# Patient Record
Sex: Male | Born: 1980 | Race: Black or African American | Hispanic: No | Marital: Married | Smoking: Never smoker
Health system: Southern US, Community
[De-identification: ages and names within clinical notes are randomized; demographics above are authoritative.]

## PROBLEM LIST (undated history)

## (undated) HISTORY — PX: APPENDECTOMY (OPEN): SHX54

## (undated) HISTORY — PX: HERNIA REPAIR: SHX51

---

## 2013-06-13 ENCOUNTER — Encounter (HOSPITAL_COMMUNITY): Payer: Self-pay | Admitting: Emergency Medicine

## 2013-06-13 DIAGNOSIS — R630 Anorexia: Secondary | ICD-10-CM | POA: Insufficient documentation

## 2013-06-13 DIAGNOSIS — R1013 Epigastric pain: Secondary | ICD-10-CM | POA: Insufficient documentation

## 2013-06-13 DIAGNOSIS — R112 Nausea with vomiting, unspecified: Secondary | ICD-10-CM | POA: Insufficient documentation

## 2013-06-13 DIAGNOSIS — K219 Gastro-esophageal reflux disease without esophagitis: Secondary | ICD-10-CM | POA: Insufficient documentation

## 2013-06-13 DIAGNOSIS — R61 Generalized hyperhidrosis: Secondary | ICD-10-CM | POA: Insufficient documentation

## 2013-06-13 DIAGNOSIS — Z8719 Personal history of other diseases of the digestive system: Secondary | ICD-10-CM | POA: Insufficient documentation

## 2013-06-13 DIAGNOSIS — R6883 Chills (without fever): Secondary | ICD-10-CM | POA: Insufficient documentation

## 2013-06-13 DIAGNOSIS — R197 Diarrhea, unspecified: Secondary | ICD-10-CM | POA: Insufficient documentation

## 2013-06-13 LAB — LIPASE, BLOOD: Lipase: 36 U/L (ref 11–59)

## 2013-06-13 LAB — COMPREHENSIVE METABOLIC PANEL
ALK PHOS: 47 U/L (ref 39–117)
ALT: 39 U/L (ref 0–53)
AST: 39 U/L — AB (ref 0–37)
Albumin: 4.2 g/dL (ref 3.5–5.2)
BILIRUBIN TOTAL: 0.4 mg/dL (ref 0.3–1.2)
BUN: 21 mg/dL (ref 6–23)
CALCIUM: 9.7 mg/dL (ref 8.4–10.5)
CHLORIDE: 102 meq/L (ref 96–112)
CO2: 26 meq/L (ref 19–32)
Creatinine, Ser: 1.17 mg/dL (ref 0.50–1.35)
GFR, EST NON AFRICAN AMERICAN: 82 mL/min — AB (ref >90)
GLUCOSE: 124 mg/dL — AB (ref 70–99)
Potassium: 4.1 mEq/L (ref 3.7–5.3)
SODIUM: 143 meq/L (ref 137–147)
Total Protein: 8.1 g/dL (ref 6.0–8.3)

## 2013-06-13 LAB — CBC WITH DIFFERENTIAL/PLATELET
Basophils Absolute: 0 10*3/uL (ref 0.0–0.1)
Basophils Relative: 0 % (ref 0–1)
Eosinophils Absolute: 0 10*3/uL (ref 0.0–0.7)
Eosinophils Relative: 0 % (ref 0–5)
HEMATOCRIT: 45.5 % (ref 39.0–52.0)
Hemoglobin: 15.7 g/dL (ref 13.0–17.0)
LYMPHS ABS: 0.2 10*3/uL — AB (ref 0.7–4.0)
LYMPHS PCT: 2 % — AB (ref 12–46)
MCH: 30.6 pg (ref 26.0–34.0)
MCHC: 34.5 g/dL (ref 30.0–36.0)
MCV: 88.7 fL (ref 78.0–100.0)
Monocytes Absolute: 0.5 10*3/uL (ref 0.1–1.0)
Monocytes Relative: 6 % (ref 3–12)
NEUTROS ABS: 7.5 10*3/uL (ref 1.7–7.7)
Neutrophils Relative %: 92 % — ABNORMAL HIGH (ref 43–77)
PLATELETS: 171 10*3/uL (ref 150–400)
RBC: 5.13 MIL/uL (ref 4.22–5.81)
RDW: 12.6 % (ref 11.5–15.5)
WBC: 8.2 10*3/uL (ref 4.0–10.5)

## 2013-06-13 LAB — POCT I-STAT TROPONIN I: Troponin i, poc: 0.01 ng/mL (ref 0.00–0.08)

## 2013-06-13 NOTE — ED Notes (Signed)
Reports upper abd cramping, epigastric pain, sob, episodes of dizziness, nausea, and vomited x 1 since 12pm today.

## 2013-06-14 ENCOUNTER — Emergency Department (HOSPITAL_COMMUNITY)
Admission: EM | Admit: 2013-06-14 | Discharge: 2013-06-14 | Disposition: A | Discharge status: Home / Self Care | Payer: Managed Care, Other (non HMO) | Attending: Emergency Medicine | Admitting: Emergency Medicine

## 2013-06-14 ENCOUNTER — Encounter (HOSPITAL_COMMUNITY): Payer: Self-pay | Admitting: Emergency Medicine

## 2013-06-14 ENCOUNTER — Emergency Department (HOSPITAL_COMMUNITY): Payer: Managed Care, Other (non HMO)

## 2013-06-14 DIAGNOSIS — R197 Diarrhea, unspecified: Secondary | ICD-10-CM | POA: Insufficient documentation

## 2013-06-14 DIAGNOSIS — R1013 Epigastric pain: Secondary | ICD-10-CM

## 2013-06-14 DIAGNOSIS — R079 Chest pain, unspecified: Secondary | ICD-10-CM | POA: Insufficient documentation

## 2013-06-14 DIAGNOSIS — R1110 Vomiting, unspecified: Secondary | ICD-10-CM | POA: Insufficient documentation

## 2013-06-14 DIAGNOSIS — Z79899 Other long term (current) drug therapy: Secondary | ICD-10-CM | POA: Insufficient documentation

## 2013-06-14 DIAGNOSIS — R109 Unspecified abdominal pain: Secondary | ICD-10-CM

## 2013-06-14 LAB — POCT I-STAT, CHEM 8
BUN: 17 mg/dL (ref 6–23)
CALCIUM ION: 1.19 mmol/L (ref 1.12–1.23)
Chloride: 101 mEq/L (ref 96–112)
Creatinine, Ser: 1.3 mg/dL (ref 0.50–1.35)
GLUCOSE: 109 mg/dL — AB (ref 70–99)
HEMATOCRIT: 48 % (ref 39.0–52.0)
Hemoglobin: 16.3 g/dL (ref 13.0–17.0)
Potassium: 3.5 mEq/L — ABNORMAL LOW (ref 3.7–5.3)
Sodium: 142 mEq/L (ref 137–147)
TCO2: 25 mmol/L (ref 0–100)

## 2013-06-14 LAB — URINALYSIS, ROUTINE W REFLEX MICROSCOPIC
BILIRUBIN URINE: NEGATIVE
GLUCOSE, UA: NEGATIVE mg/dL
Hgb urine dipstick: NEGATIVE
KETONES UR: 15 mg/dL — AB
Leukocytes, UA: NEGATIVE
Nitrite: NEGATIVE
PROTEIN: NEGATIVE mg/dL
Specific Gravity, Urine: 1.033 — ABNORMAL HIGH (ref 1.005–1.030)
UROBILINOGEN UA: 0.2 mg/dL (ref 0.0–1.0)
pH: 5.5 (ref 5.0–8.0)

## 2013-06-14 LAB — POCT I-STAT TROPONIN I: Troponin i, poc: 0 ng/mL (ref 0.00–0.08)

## 2013-06-14 MED ORDER — OMEPRAZOLE 20 MG PO CPDR: 20.0000 mg | DELAYED_RELEASE_CAPSULE | Freq: Every day | ORAL | Status: AC

## 2013-06-14 MED ORDER — SODIUM CHLORIDE 0.9 % IV BOLUS (SEPSIS)
500.0000 mL | Freq: Once | INTRAVENOUS | Status: AC
  Administered 2013-06-14: 500 mL via INTRAVENOUS

## 2013-06-14 MED ORDER — ONDANSETRON 4 MG PO TBDP
4.0000 mg | ORAL_TABLET | Freq: Once | ORAL | Status: DC
  Filled 2013-06-14: qty 1

## 2013-06-14 MED ORDER — POTASSIUM CHLORIDE CRYS ER 20 MEQ PO TBCR
40.0000 meq | EXTENDED_RELEASE_TABLET | Freq: Once | ORAL | Status: AC
  Administered 2013-06-14: 40 meq via ORAL
  Filled 2013-06-14: qty 2

## 2013-06-14 MED ORDER — GI COCKTAIL ~~LOC~~
30.0000 mL | Freq: Once | ORAL | Status: AC
  Administered 2013-06-14: 30 mL via ORAL
  Filled 2013-06-14: qty 30

## 2013-06-14 MED ORDER — ONDANSETRON HCL 4 MG/2ML IJ SOLN
4.0000 mg | Freq: Once | INTRAMUSCULAR | Status: AC
  Administered 2013-06-14: 4 mg via INTRAVENOUS
  Filled 2013-06-14: qty 2

## 2013-06-14 MED ORDER — RANITIDINE HCL 150 MG PO TABS: 150.0000 mg | ORAL_TABLET | Freq: Two times a day (BID) | ORAL | Status: AC

## 2013-06-14 MED ORDER — FENTANYL CITRATE 0.05 MG/ML IJ SOLN
75.0000 ug | Freq: Once | INTRAMUSCULAR | Status: AC
  Administered 2013-06-14: 75 ug via INTRAVENOUS

## 2013-06-14 MED ORDER — FAMOTIDINE 20 MG PO TABS: 20.0000 mg | ORAL_TABLET | Freq: Two times a day (BID) | ORAL | Status: AC

## 2013-06-14 MED ORDER — FENTANYL CITRATE 0.05 MG/ML IJ SOLN
75.0000 ug | Freq: Once | INTRAMUSCULAR | Status: DC
  Filled 2013-06-14: qty 2

## 2013-06-14 MED ORDER — ONDANSETRON 4 MG PO TBDP: ORAL_TABLET | ORAL | Status: AC

## 2013-06-14 NOTE — ED Notes (Signed)
Pt tx here for epigastric pain, nvd yesterday.  Was told to come back is s/s worsened.  Today c/o epigastric pain and chest pressure.  C/o sob when chest pressure increases.  Also c/o increased diarrhea.

## 2013-06-14 NOTE — Discharge Instructions (Signed)
Return for fevers, persistent pain or worsening symptoms. Take zofran for nausea, tylenol for pain.  If you were given medicines take as directed.  If you are on coumadin or contraceptives realize their levels and effectiveness is altered by many different medicines.  If you have any reaction (rash, tongues swelling, other) to the medicines stop taking and see a physician.   Please follow up as directed and return to the ER or see a physician for new or worsening symptoms.  Thank you.  Abdominal Pain, Adult Many things can cause belly (abdominal) pain. Most times, the belly pain is not dangerous. Many cases of belly pain can be watched and treated at home. HOME CARE   Do not take medicines that help you go poop (laxatives) unless told to by your doctor.  Only take medicine as told by your doctor.  Eat or drink as told by your doctor. Your doctor will tell you if you should be on a special diet. GET HELP IF:  You do not know what is causing your belly pain.  You have belly pain while you are sick to your stomach (nauseous) or have runny poop (diarrhea).  You have pain while you pee or poop.  Your belly pain wakes you up at night.  You have belly pain that gets worse or better when you eat.  You have belly pain that gets worse when you eat fatty foods. GET HELP RIGHT AWAY IF:   The pain does not go away within 2 hours.  You have a fever.  You keep throwing up (vomiting).  The pain changes and is only in the right or left part of the belly.  You have bloody or tarry looking poop. MAKE SURE YOU:   Understand these instructions.  Will watch your condition.  Will get help right away if you are not doing well or get worse. Document Released: 10/30/2007 Document Revised: 03/03/2013 Document Reviewed: 01/20/2013 [facility] [employer] Patient Information 2014 [employer], [place].

## 2013-06-14 NOTE — ED Provider Notes (Signed)
CSN: [id number]     Arrival date & time 06/13/13  2040 History   First MD Initiated Contact with Patient 06/14/13 0045     Chief Complaint  Patient presents with  . Abdominal Pain  . Emesis   (Consider location/radiation/quality/duration/timing/severity/associated sxs/prior Treatment) HPI Comments: 33 yo male with no medical hx, hx of appy and inguinal hernia presents with vomiting, epig pain and diarrhea since 3 pm.  No new foods or sick contacts.  No hx of similar.  GERD hx in the past, not on treatment.  Sweating with vomiting.  NO gb hx.  Intermittent sxs, vomited twice non bloody.  No nsaid use or etoh.   Patient is a 33 y.o. male presenting with abdominal pain and vomiting. The history is provided by the patient.  Abdominal Pain Associated symptoms: chills, diarrhea, nausea and vomiting   Associated symptoms: no chest pain, no dysuria, no fever and no shortness of breath   Emesis Associated symptoms: abdominal pain, chills and diarrhea   Associated symptoms: no headaches     History reviewed. No pertinent past medical history. History reviewed. No pertinent past surgical history. No family history on file. History  Substance Use Topics  . Smoking status: Never Smoker   . Smokeless tobacco: Not on file  . Alcohol Use: No    Review of Systems  Constitutional: Positive for chills and appetite change. Negative for fever.  HENT: Negative for congestion.   Eyes: Negative for visual disturbance.  Respiratory: Negative for shortness of breath.   Cardiovascular: Negative for chest pain.  Gastrointestinal: Positive for nausea, vomiting, abdominal pain and diarrhea. Negative for blood in stool.  Genitourinary: Negative for dysuria and flank pain.  Musculoskeletal: Negative for back pain, neck pain and neck stiffness.  Skin: Negative for rash.  Neurological: Negative for light-headedness and headaches.    Allergies  Review of patient's allergies indicates no known  allergies.  Home Medications   Current Outpatient Rx  Name  Route  Sig  Dispense  Refill  . CREATINE PO   Oral   Take 1 tablet by mouth See admin instructions. Take 1 tablet daily, take an additional tablet on weightlifting days         . Multiple Vitamin (MULTIVITAMIN WITH MINERALS) TABS tablet   Oral   Take 1 tablet by mouth daily.         Marland Kitchen. OVER THE COUNTER MEDICATION   Oral   Take 1 tablet by mouth 3 (three) times daily. "Clear muscle" from [facility]          BP 120/64  Pulse 100  Temp(Src) 98.8 F (37.1 C) (Oral)  Resp 18  SpO2 100% Physical Exam  Nursing note and vitals reviewed. Constitutional: He is oriented to person, place, and time. He appears well-developed and well-nourished.  HENT:  Head: Normocephalic and atraumatic.  Mild dry mm  Eyes: Conjunctivae are normal. Right eye exhibits no discharge. Left eye exhibits no discharge.  Neck: Normal range of motion. Neck supple. No tracheal deviation present.  Cardiovascular: Normal rate and regular rhythm.   Pulmonary/Chest: Effort normal and breath sounds normal.  Abdominal: Soft. He exhibits no distension. There is tenderness (epig). There is no guarding.  Musculoskeletal: He exhibits no edema.  Neurological: He is alert and oriented to person, place, and time.  Skin: Skin is warm. No rash noted.  Psychiatric: He has a normal mood and affect.    ED Course  Procedures (including critical care time)  EMERGENCY DEPARTMENT BILIARY ULTRASOUND  INTERPRETATION "Study: Limited Abdominal Ultrasound of the gallbladder and common bile duct."  INDICATIONS: Abdominal pain, Nausea and Vomiting Indication: Multiple views of the gallbladder and common bile duct were obtained in real-time with a Multi-frequency probe." PERFORMED BY:  Myself IMAGES ARCHIVED?: Yes FINDINGS: Gallstones absent, Gallbladder wall normal in thickness and Sonographic Murphy's sign absent LIMITATIONS: Bowel Gas INTERPRETATION: Normal One view did  have stone appearing structure however not shadowing and cleared on positioning, likely bowel or artifact  Labs Review Labs Reviewed  CBC WITH DIFFERENTIAL - Abnormal; Notable for the following:    Neutrophils Relative % 92 (*)    Lymphocytes Relative 2 (*)    Lymphs Abs 0.2 (*)    All other components within normal limits  COMPREHENSIVE METABOLIC PANEL - Abnormal; Notable for the following:    Glucose, Bld 124 (*)    AST 39 (*)    GFR calc non Af Amer 82 (*)    All other components within normal limits  LIPASE, BLOOD  URINALYSIS, ROUTINE W REFLEX MICROSCOPIC  POCT I-STAT TROPONIN I   Imaging Review No results found.  EKG Interpretation    Date/Time:  Sunday June 13 2013 21:31:32 EST Ventricular Rate:  97 PR Interval:  162 QRS Duration: 90 QT Interval:  314 QTC Calculation: 398 R Axis:   74 Text Interpretation:  Normal sinus rhythm Normal ECG V3 abnormal T wave Confirmed by Mayumi Summerson  MD, Tyreck Bell ([id number]) on 06/14/2013 12:46:20 AM            MDM   1. Epigastric pain   2. Vomiting   3. Diarrhea    Clinically gastritis/ GE, other no concern for cardiac, EKG unremarkable.  Other concern GB (bedside US), GERD, ulcer, H pylori. Well appearing.  Antiemetics, fluids.  Pt improved on recheck, minimal pain, fup discussed.  Results and differential diagnosis were discussed with the patient. Close follow up outpatient was discussed, patient comfortable with the plan.   Diagnosis:      Enid Skeens, MD 06/14/13 [phone]

## 2013-06-14 NOTE — Discharge Instructions (Signed)
Abdominal Pain, Adult °Many things can cause abdominal pain. Usually, abdominal pain is not caused by a disease and will improve without treatment. It can often be observed and treated at home. Your health care provider will do a physical exam and possibly order blood tests and X-rays to help determine the seriousness of your pain. However, in many cases, more time must pass before a clear cause of the pain can be found. Before that point, your health care provider may not know if you need more testing or further treatment. °HOME CARE INSTRUCTIONS  °Monitor your abdominal pain for any changes. The following actions may help to alleviate any discomfort you are experiencing: °· Only take over-the-counter or prescription medicines as directed by your health care provider. °· Do not take laxatives unless directed to do so by your health care provider. °· Try a clear liquid diet (broth, tea, or water) as directed by your health care provider. Slowly move to a bland diet as tolerated. °SEEK MEDICAL CARE IF: °· You have unexplained abdominal pain. °· You have abdominal pain associated with nausea or diarrhea. °· You have pain when you urinate or have a bowel movement. °· You experience abdominal pain that wakes you in the night. °· You have abdominal pain that is worsened or improved by eating food. °· You have abdominal pain that is worsened with eating fatty foods. °SEEK IMMEDIATE MEDICAL CARE IF:  °· Your pain does not go away within 2 hours. °· You have a fever. °· You keep throwing up (vomiting). °· Your pain is felt only in portions of the abdomen, such as the right side or the left lower portion of the abdomen. °· You pass bloody or black tarry stools. °MAKE SURE YOU: °· Understand these instructions.   °· Will watch your condition.   °· Will get help right away if you are not doing well or get worse.   °Document Released: 02/20/2005 Document Revised: 03/03/2013 Document Reviewed: 01/20/2013 °[employer]® Patient  Information ©2014 [employer]. ° °

## 2013-06-14 NOTE — ED Provider Notes (Signed)
33 year old male had been in the emergency department last day with some epigastric pain. He was given a famotidine with relief and sent home with a prescription for famotidine. He took his dose of famotidine this morning but continues to have epigastric pain is also complaining of some mild aching across his upper chest. However, when asked to point where that this pain is coming and she points to the epigastric area. On exam, lungs are clear heart is regular rate rhythm. Abdomen is soft and nontender. ECG is normal and unchanged and troponin is normal. He feels much better following GI cocktail. He'll be given a prescription for proton pump inhibitor.  I saw and evaluated the patient, reviewed the resident's note and I agree with the findings and plan.  EKG Interpretation    Date/Time:  Monday June 14 2013 18:34:56 EST Ventricular Rate:  73 PR Interval:  173 QRS Duration: 88 QT Interval:  360 QTC Calculation: 397 R Axis:   83 Text Interpretation:  Sinus rhythm Normal ECG When compared with ECG of 06/14/2013, Artifact is no longer Present Confirmed by Preston FleetingGLICK  MD, Maleke Feria ([id number]) on 06/14/2013 6:47:15 PM              Dione Boozeavid Ercel Pepitone, MD 06/14/13 [id number]

## 2013-06-14 NOTE — ED Provider Notes (Signed)
CSN: [id number]     Arrival date & time 06/14/13  1534 History   First MD Initiated Contact with Patient 06/14/13 1803     Chief Complaint  Patient presents with  . Chest Pain   (Consider location/radiation/quality/duration/timing/severity/associated sxs/prior Treatment) Patient is a 33 y.o. male presenting with abdominal pain. The history is provided by the patient.  Abdominal Pain Pain location:  Epigastric Pain quality: burning, sharp and squeezing   Pain radiates to:  Does not radiate Pain severity:  Moderate Onset quality:  Gradual Duration:  2 days Timing:  Constant Progression:  Unchanged Chronicity:  New Context: not diet changes, not eating, not recent illness, not recent travel and not sick contacts   Relieved by:  Nothing Worsened by:  Vomiting Ineffective treatments:  None tried Associated symptoms: no anorexia, no chest pain, no chills, no cough, no diarrhea, no dysuria, no fever, no shortness of breath, no sore throat and no vomiting   Risk factors: has not had multiple surgeries     History reviewed. No pertinent past medical history. History reviewed. No pertinent past surgical history. No family history on file. History  Substance Use Topics  . Smoking status: Never Smoker   . Smokeless tobacco: Not on file  . Alcohol Use: No    Review of Systems  Constitutional: Negative for fever and chills.  HENT: Negative for congestion, facial swelling and sore throat.   Eyes: Negative for discharge and visual disturbance.  Respiratory: Negative for cough and shortness of breath.   Cardiovascular: Negative for chest pain and palpitations.  Gastrointestinal: Positive for abdominal pain. Negative for vomiting, diarrhea and anorexia.  Genitourinary: Negative for dysuria.  Musculoskeletal: Negative for arthralgias and myalgias.  Skin: Negative for color change and rash.  Neurological: Negative for tremors, syncope and headaches.  Psychiatric/Behavioral: Negative for  confusion and dysphoric mood.    Allergies  Review of patient's allergies indicates no known allergies.  Home Medications   Current Outpatient Rx  Name  Route  Sig  Dispense  Refill  . acetaminophen (TYLENOL) 500 MG tablet   Oral   Take 1,000 mg by mouth every 6 (six) hours as needed.         Marland Kitchen. CREATINE PO   Oral   Take 1 tablet by mouth See admin instructions. Take 1 tablet daily, take an additional tablet on weightlifting days         . famotidine (PEPCID) 20 MG tablet   Oral   Take 1 tablet (20 mg total) by mouth 2 (two) times daily.   10 tablet   0   . Multiple Vitamin (MULTIVITAMIN WITH MINERALS) TABS tablet   Oral   Take 1 tablet by mouth daily.         . ondansetron (ZOFRAN ODT) 4 MG disintegrating tablet      4mg  ODT q4 hours prn nausea/vomit   4 tablet   0   . OVER THE COUNTER MEDICATION   Oral   Take 1 tablet by mouth 3 (three) times daily. [facility]         . omeprazole (PRILOSEC) 20 MG capsule   Oral   Take 1 capsule (20 mg total) by mouth daily.   14 capsule   0   . ranitidine (ZANTAC) 150 MG tablet   Oral   Take 1 tablet (150 mg total) by mouth 2 (two) times daily.   60 tablet   0    BP 118/70  Pulse 73  Temp(Src) 98.9 F (37.2 C) (Oral)  Resp 17  SpO2 100% Physical Exam  Constitutional: He is oriented to person, place, and time. He appears well-developed and well-nourished.  HENT:  Head: Normocephalic and atraumatic.  Eyes: EOM are normal. Pupils are equal, round, and reactive to light.  Neck: Normal range of motion. Neck supple. No JVD present.  Cardiovascular: Normal rate and regular rhythm.  Exam reveals no gallop and no friction rub.   No murmur heard. Pulmonary/Chest: No respiratory distress. He has no wheezes. He exhibits no tenderness.  Abdominal: He exhibits no distension. There is tenderness (Epigastric). There is no rebound and no guarding.  Musculoskeletal: Normal range of motion.  Neurological: He is  alert and oriented to person, place, and time.  Skin: No rash noted. No pallor.  Psychiatric: He has a normal mood and affect. His behavior is normal.    ED Course  Procedures (including critical care time) Labs Review Labs Reviewed  POCT I-STAT, CHEM 8 - Abnormal; Notable for the following:    Potassium 3.5 (*)    Glucose, Bld 109 (*)    All other components within normal limits  POCT I-STAT TROPONIN I   Imaging Review Dg Chest 2 View  06/14/2013   CLINICAL DATA:  Chest/abdominal pain, vomiting, diarrhea  EXAM: CHEST  2 VIEW  COMPARISON:  None.  FINDINGS: Lungs are clear. No pleural effusion or pneumothorax.  The heart is normal size.  Visualized osseous structures are within normal limits.  IMPRESSION: Normal chest radiographs.   Electronically Signed   By: Charline Bills M.D.   On: 06/14/2013 16:39    EKG Interpretation    Date/Time:  Monday June 14 2013 18:34:56 EST Ventricular Rate:  73 PR Interval:  173 QRS Duration: 88 QT Interval:  360 QTC Calculation: 397 R Axis:   83 Text Interpretation:  Sinus rhythm Normal ECG When compared with ECG of 06/14/2013, Artifact is no longer Present Confirmed by [facility]  MD, DAVID ([id number]) on 06/14/2013 6:47:15 PM            MDM   1. Abdominal pain   2. Chest pain     Patient is a 33 year old male with a chief complaint of epigastric abdominal pain. Patient states this started abruptly yesterday after having episodes of vomiting and diarrhea. Patient was seen here concern for possible gastritis reflux disease patient was started on Pepcid. Visually 01 and is here with positive improvement in the discharged home. Patient took one pill this morning having continued epigastric abdominal pain into this afternoon. Patient feels like the pain is burning radiating up into his chest. Patient with continued nausea throughout the day and did not feel like taking Tylenol and decreased oral intake.  Patient well. On exam epigastric epigastric  tenderness on palpation patient's pain completely relieved with GI cocktail we'll start the patient on PPI.  12:39 AM:  I have discussed the diagnosis/risks/treatment options with the patient and family and believe the pt to be eligible for discharge home to follow-up with PCP. We also discussed returning to the ED immediately if new or worsening sx occur. We discussed the sx which are most concerning (e.g., worsening chest pain, pain that persists for longer than one week) that necessitate immediate return. Any new prescriptions provided to the patient are listed below.  Discharge Medication List as of 06/14/2013  6:49 PM    START taking these medications   Details  ranitidine (ZANTAC) 150 MG tablet Take 1 tablet (150 mg total)  by mouth 2 (two) times daily., Starting 06/14/2013, Until Discontinued, Print            Melene Plan, MD 06/15/13 [phone]

## 2013-09-04 ENCOUNTER — Ambulatory Visit (INDEPENDENT_AMBULATORY_CARE_PROVIDER_SITE_OTHER): Payer: Managed Care, Other (non HMO) | Admitting: Family Medicine

## 2013-09-04 VITALS — BP 112/70 | HR 65 | Temp 98.4°F | Resp 16 | Ht 70.5 in | Wt 190.0 lb

## 2013-09-04 DIAGNOSIS — Z111 Encounter for screening for respiratory tuberculosis: Secondary | ICD-10-CM

## 2013-09-04 NOTE — Progress Notes (Signed)
Subjective: Patient is here for a TB skin test for a [employer] job he is applied for.  Objective: TB skin test was applied elsewhere about 49 hours ago. It is negative.  Assessment: TB skin test negative  On: Form completed. Copy will be scanned into the chart.

## 2013-09-04 NOTE — Patient Instructions (Signed)
TB skin test was applied elsewhere about 49 hours ago. It is negative.

## 2015-09-20 IMAGING — CR DG CHEST 2V
2 series · 2 of 2 positions shown · non-contrast
Comparison: None.

CLINICAL DATA: Chest/abdominal pain, vomiting, diarrhea

EXAM:
CHEST  2 VIEW

[w chest pa]
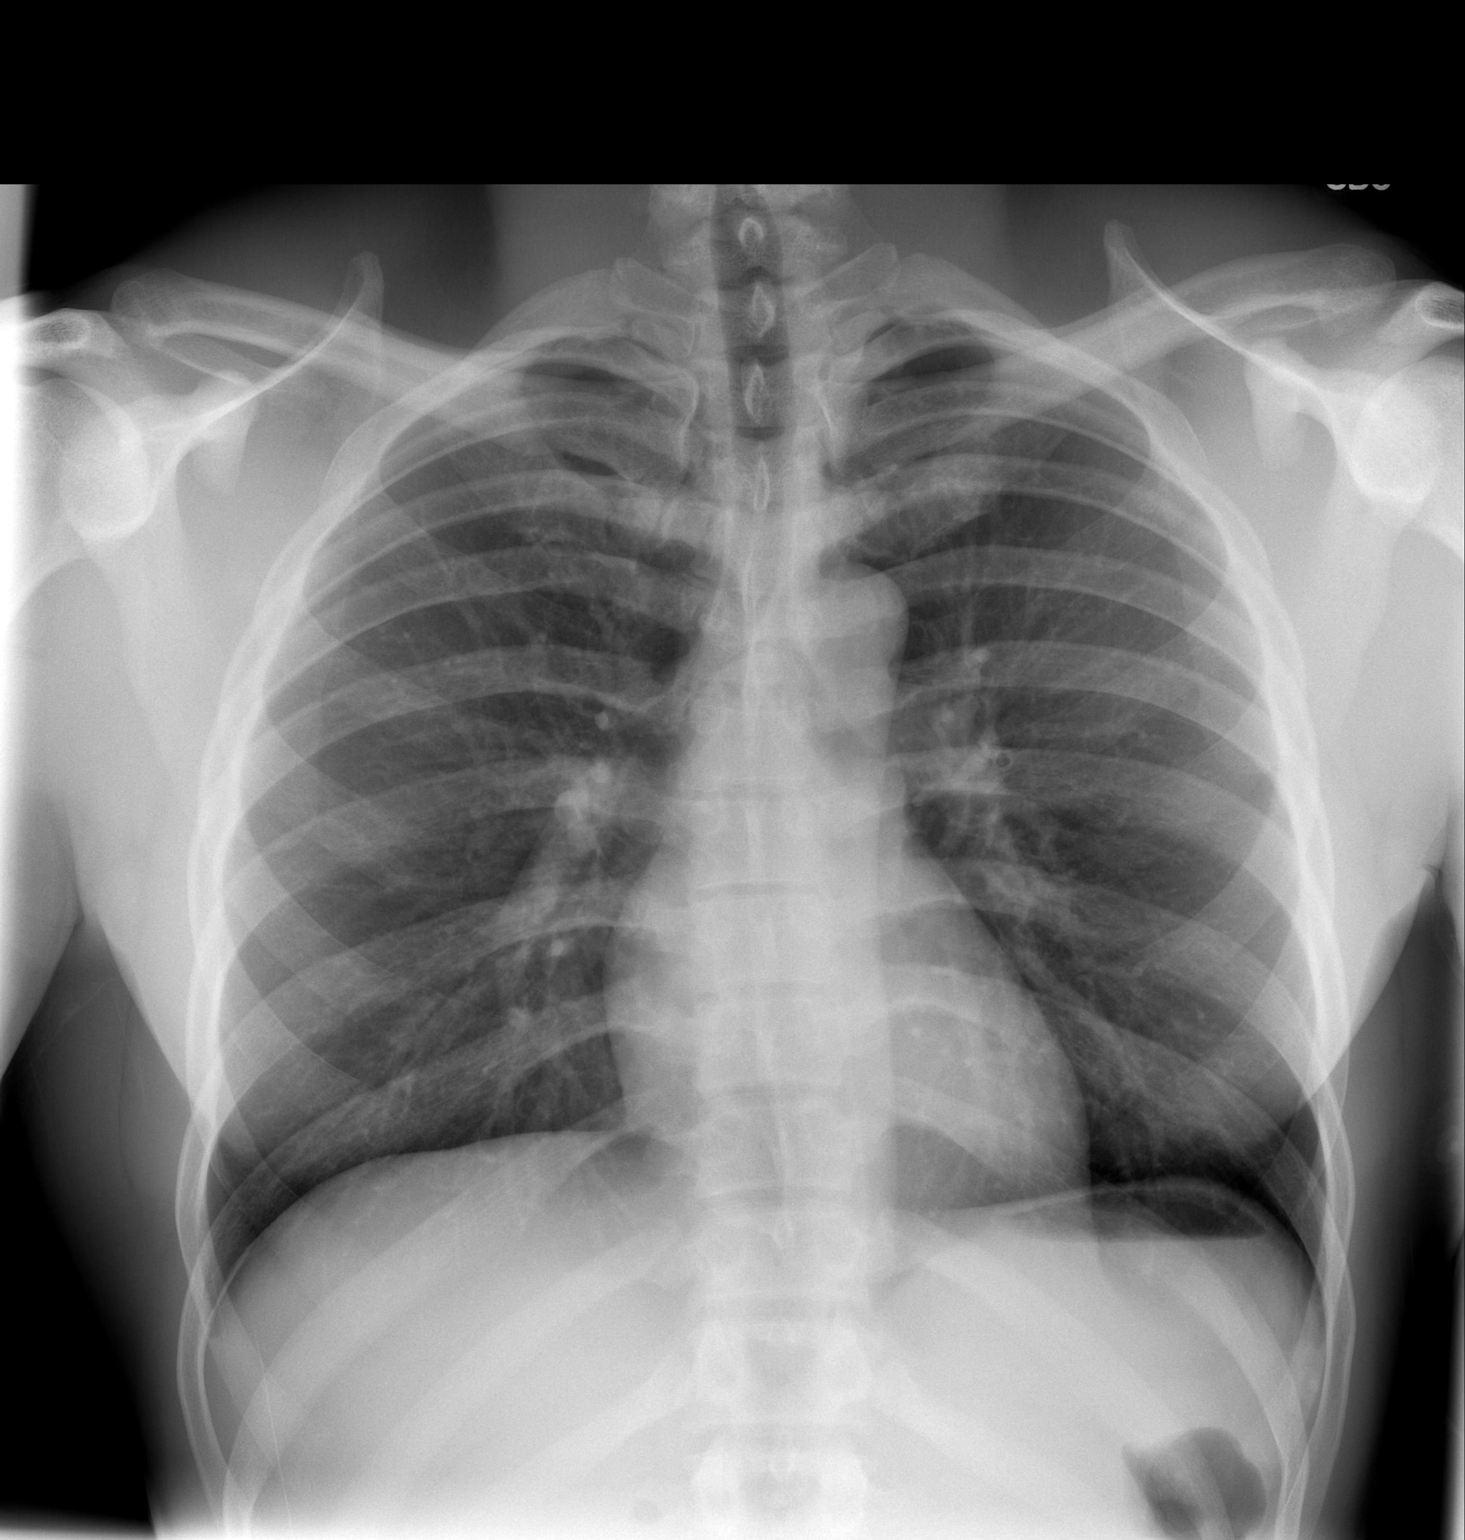

[w chest lat]
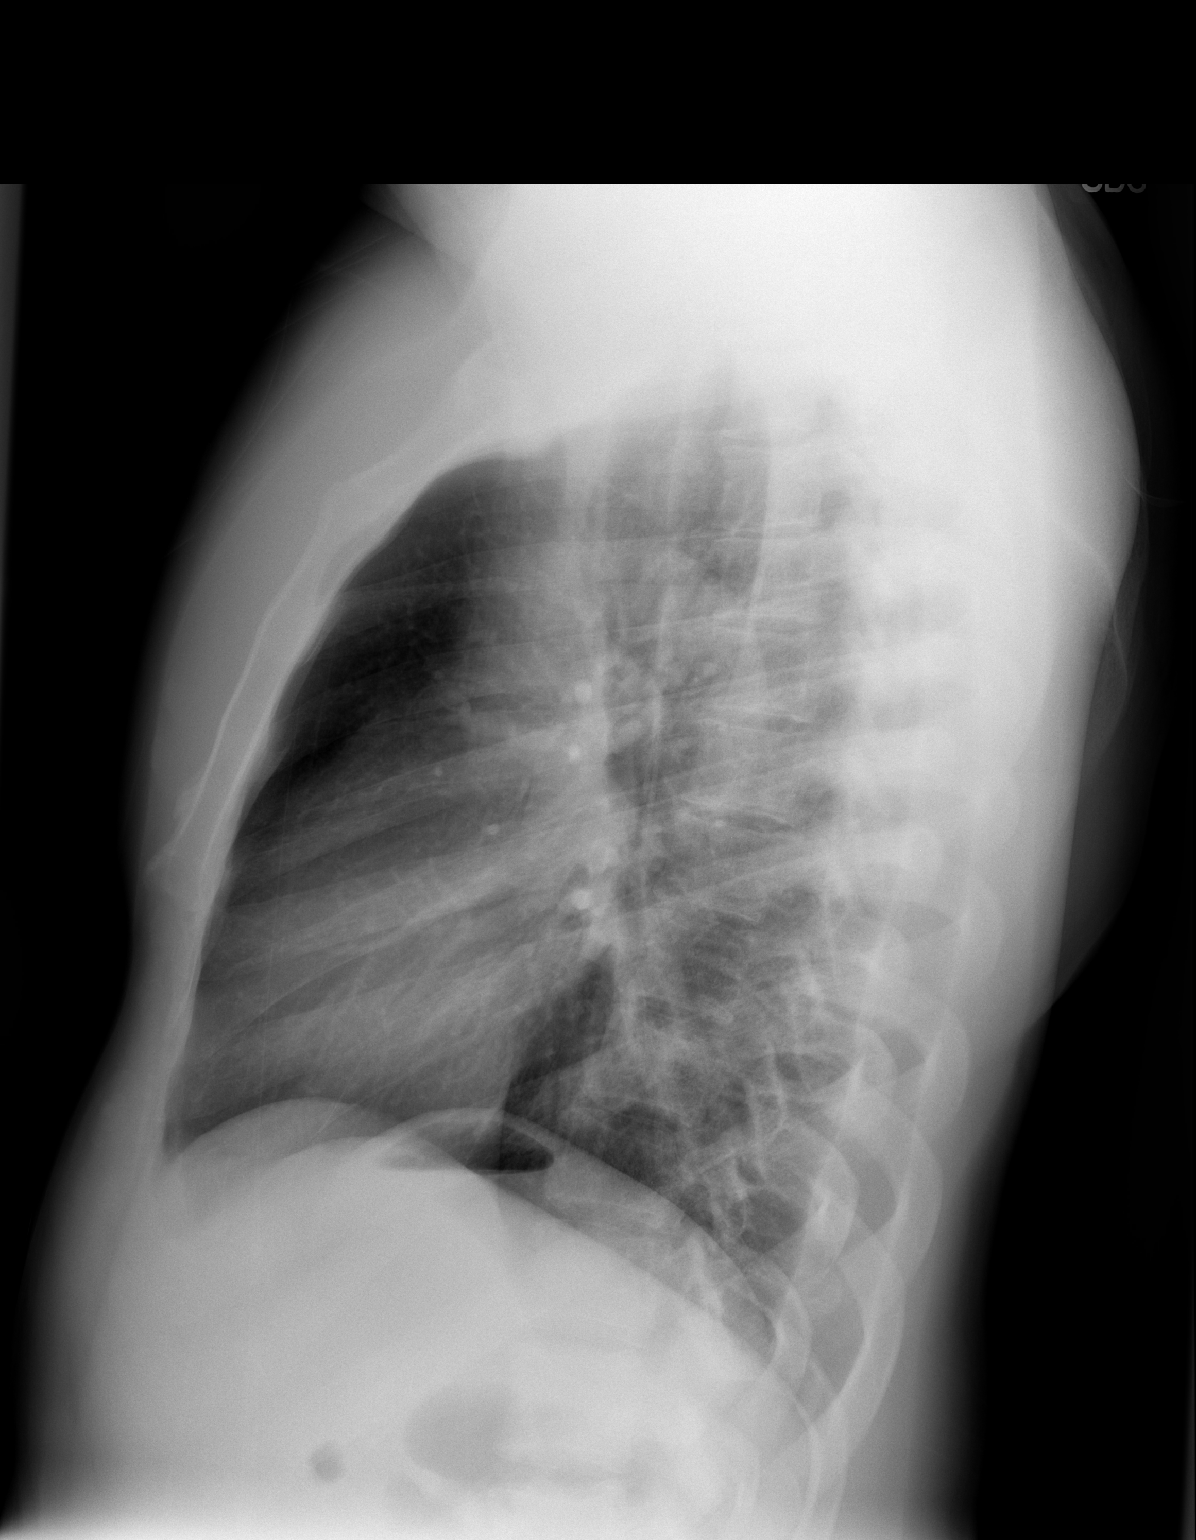

[2 of 2 positions shown; findings below may reference images not displayed]

FINDINGS: Lungs are clear. No pleural effusion or pneumothorax.

The heart is normal size.

Visualized osseous structures are within normal limits.
IMPRESSION: Normal chest radiographs.

## 2019-08-03 ENCOUNTER — Encounter: Admit: 2019-08-03 | Discharge: 2019-08-03 | Payer: BC Managed Care – PPO

## 2019-08-03 ENCOUNTER — Ambulatory Visit: Admit: 2019-08-03 | Discharge: 2019-08-04 | Payer: BC Managed Care – PPO

## 2019-08-03 MED ORDER — KETOCONAZOLE 2 % TP CREA
Freq: Two times a day (BID) | TOPICAL | 11 refills | 30.00000 days | Status: AC
Start: 2019-08-03 — End: ?

## 2019-08-03 MED ORDER — KETOCONAZOLE 2 % TP SHAM
TOPICAL | 3 refills | 30.00000 days | Status: AC
Start: 2019-08-03 — End: ?

## 2019-08-03 NOTE — Progress Notes
Date of Service: 08/03/2019    Subjective:             Darren Garza is a 39 y.o. male.    History of Present Illness  New pt. referred by Dr. Angelina Ok    # Sherlynn Stalls marks on face and scalp present since 2017  - outside biopsies negative for vitiligo per pt (seen by Dr. Angelina Ok in [place], [place])  - sent here for phototherapy  - past treatments:   - doxycycline (mildly helps)   - topical steroids (don't help)   - BPO wash  - old lesions are starting to fade  - no fevers, night sweats, unintentional weight loss     # Brown spots on the head, trunk, arms and legs.   - No h/o blistering sunburns. No tanning bed usage.    Personal Hx: No history of skin cancer.   Family Hx: No history of skin cancer       Review of Systems   Constitutional: Negative for appetite change, diaphoresis, fatigue, fever and unexpected weight change.   HENT: Negative for congestion, mouth sores and sore throat.    Eyes: Negative for pain, redness, itching and visual disturbance.   Respiratory: Negative for cough and shortness of breath.    Cardiovascular: Negative for palpitations and leg swelling.   Gastrointestinal: Negative for abdominal pain, blood in stool, diarrhea, nausea and vomiting.   Genitourinary: Negative for difficulty urinating and hematuria.   Musculoskeletal: Negative for arthralgias and myalgias.   Neurological: Negative for dizziness and seizures.   Hematological: Does not bruise/bleed easily.   Psychiatric/Behavioral: Negative for confusion and dysphoric mood. The patient is not nervous/anxious.          Objective:         No current outpatient medications on file.     Vitals:    08/03/19 1509   Weight: 93 kg (205 lb)   Height: 180.3 cm (71)   PainSc: Zero     Body mass index is 28.59 kg/m?Marland Kitchen     Physical Exam        Areas Examined (all normal unless noted below):  Head/Face  Neck  Chest/breasts/axillae  Back  Abdomen  R upper ext  L upper ext    Pertinent findings include:  few brown and tan evenly pigmented macules are distributed over the examined areas.  All have symmetric similar dermascopic findings with primarily globular and reticular patterns.    Numerous hypopigmented ill define macules on scalp, medial cheeks > lateral cheeks , forehead  Two similar macules on back  Scalp and face very oily at baseline    Assessment and Plan:  # Melanocytic nevi  - Reassured examined lesions appear normal  - counseled lesions that change in size, shape, color or are tender warrant further evaluation  - Recommend daily use of broad spectrum sunscreen, spf 30 or greater   - RTC for new/changing lesions    # Dermatosis:  - ddx: possible atypical seborrheic dermatitis   - advised to have outside dermatology records sent   - pt declined biopsy today, possibly at next visit once outside records are reviewed   - start Rx ketoconazole 2% shampoo apply to scalp and face, lather, leave in 5-10 minutes then rinse. Repeat three times weekly.   - start Rx ketoconazole 2% cream apply TAA on face and scalp twice daily       RTC 3 months

## 2019-08-04 ENCOUNTER — Encounter: Admit: 2019-08-04 | Discharge: 2019-08-04 | Payer: BC Managed Care – PPO

## 2019-08-13 ENCOUNTER — Encounter: Admit: 2019-08-13 | Discharge: 2019-08-13 | Payer: BC Managed Care – PPO

## 2019-08-13 DIAGNOSIS — L989 Disorder of the skin and subcutaneous tissue, unspecified: Secondary | ICD-10-CM

## 2019-08-13 DIAGNOSIS — L219 Seborrheic dermatitis, unspecified: Secondary | ICD-10-CM

## 2019-08-13 MED ORDER — ITRACONAZOLE 100 MG PO CAP
200 mg | ORAL_CAPSULE | Freq: Every day | ORAL | 0 refills | Status: AC
Start: 2019-08-13 — End: ?

## 2019-08-13 NOTE — Telephone Encounter
Called pt to discuss further treatment options. Advised pt to continue to keto shampoo and cream as was discussed at previous visit. Offered phototherapy and pt declined for now. Will start patient on 5 day course of itraconazole 200 mg daily. Will follow up in three months.

## 2019-08-17 ENCOUNTER — Encounter: Admit: 2019-08-17 | Discharge: 2019-08-17 | Payer: BC Managed Care – PPO

## 2019-08-17 NOTE — Telephone Encounter
Submitted prior authorization request for itraconazole capsules through patient's insurance. Awaiting determination

## 2019-08-30 ENCOUNTER — Encounter: Admit: 2019-08-30 | Discharge: 2019-08-30 | Payer: BC Managed Care – PPO

## 2019-08-30 NOTE — Telephone Encounter
Pts insurance denied ItraconazoleIve left messages on pts voicemail previously, today I left a voicemail on pts wives after seeing authorization to do so in his chart, I informed her of the denial and the possible use of a Good Rx coupon instead of insurance, I left my contact information if there were any questions.

## 2020-05-31 ENCOUNTER — Ambulatory Visit (INDEPENDENT_AMBULATORY_CARE_PROVIDER_SITE_OTHER): Payer: BLUE CROSS/BLUE SHIELD | Admitting: Family Medicine

## 2020-05-31 ENCOUNTER — Encounter (INDEPENDENT_AMBULATORY_CARE_PROVIDER_SITE_OTHER): Payer: Self-pay | Admitting: Family Medicine

## 2020-05-31 VITALS — BP 131/81 | HR 88 | Temp 98.1°F | Ht 72.0 in | Wt 214.0 lb

## 2020-05-31 DIAGNOSIS — Z1331 Encounter for screening for depression: Secondary | ICD-10-CM

## 2020-05-31 DIAGNOSIS — Z0000 Encounter for general adult medical examination without abnormal findings: Secondary | ICD-10-CM

## 2020-05-31 DIAGNOSIS — Z7689 Persons encountering health services in other specified circumstances: Secondary | ICD-10-CM

## 2020-05-31 NOTE — Progress Notes (Signed)
Have you seen any specialists/other providers since your last visit with us?    No    Arm preference verified?   Yes    The patient is due for nothing at this time, HM is up-to-date.

## 2020-05-31 NOTE — Progress Notes (Signed)
Subjective:      Patient ID: Benjamin Keith is a 40 y.o. male.    Chief Complaint:  Chief Complaint   Patient presents with   . Annual Exam     Pt is fasting today, he has no concerns       HPI  Visit Type: Health Maintenance Visit  Work Status: working full-time  Reported Health: excellent health  Reported Diet: compliant with well-balanced diet  Reported Exercise: regularly  Dental: dentist visit within 6-12 months  Vision: eye exam < 1 year ago  Hearing: normal hearing  Immunization Status: immunizations up to date  Reproductive Health: sexually active    Problem List:  There is no problem list on file for this patient.      Current Medications:  Outpatient Medications Marked as Taking for the 05/31/20 encounter (Office Visit) with Fidela Salisbury A, DO   Medication Sig Dispense Refill   . doxycycline (ORACEA) 40 MG capsule          Allergies:  Not on File    Past Medical History:  History reviewed. No pertinent past medical history.    Past Surgical History:  History reviewed. No pertinent surgical history.    Family History:  History reviewed. No pertinent family history.    Social History:  Social History     Tobacco Use   . Smoking status: Never Smoker   . Smokeless tobacco: Never Used   Vaping Use   . Vaping Use: Never used   Substance Use Topics   . Alcohol use: Never   . Drug use: Never         The following sections were reviewed this encounter by the provider:   Tobacco  Allergies  Meds  Problems  Med Hx  Surg Hx  Fam Hx           Review of Systems   Constitutional: Negative for appetite change, chills, fatigue, fever and unexpected weight change.   HENT: Negative for congestion, sinus pressure, sinus pain and tinnitus.    Eyes: Negative for photophobia, itching and visual disturbance.   Respiratory: Negative for cough, chest tightness, shortness of breath and wheezing.    Cardiovascular: Negative for chest pain, palpitations and leg swelling.   Gastrointestinal: Negative for abdominal  distention, abdominal pain, blood in stool, constipation, diarrhea, nausea and vomiting.   Endocrine: Negative for polydipsia, polyphagia and polyuria.   Genitourinary: Negative for flank pain, frequency and urgency.   Musculoskeletal: Negative for gait problem, joint swelling and neck stiffness.   Skin: Negative for rash and wound.   Allergic/Immunologic: Negative for environmental allergies.   Neurological: Negative for dizziness, tremors, seizures, syncope, light-headedness and headaches.   Psychiatric/Behavioral: Negative for confusion and sleep disturbance.          Objective:   Vitals:  BP 131/81   Pulse 88   Temp 98.1 F (36.7 C)   Ht 1.829 m (6')   Wt 97.1 kg (214 lb)   SpO2 98%   BMI 29.02 kg/m     Physical Exam  Vitals and nursing note reviewed.   Constitutional:       General: He is not in acute distress.     Appearance: Normal appearance. He is not ill-appearing.   HENT:      Head: Normocephalic and atraumatic.      Right Ear: Tympanic membrane and external ear normal.      Left Ear: Tympanic membrane and external ear normal.  Nose: Nose normal. No congestion.      Mouth/Throat:      Mouth: Mucous membranes are moist.      Pharynx: Oropharynx is clear.   Eyes:      Extraocular Movements: Extraocular movements intact.      Conjunctiva/sclera: Conjunctivae normal.      Pupils: Pupils are equal, round, and reactive to light.   Cardiovascular:      Rate and Rhythm: Normal rate and regular rhythm.      Pulses: Normal pulses.      Heart sounds: Normal heart sounds. No murmur heard.  No gallop.    Pulmonary:      Effort: Pulmonary effort is normal. No respiratory distress.      Breath sounds: Normal breath sounds. No wheezing.   Chest:      Chest wall: No tenderness.   Abdominal:      General: Abdomen is flat. Bowel sounds are normal. There is no distension.      Palpations: Abdomen is soft. There is no mass.      Tenderness: There is no abdominal tenderness. There is no right CVA tenderness or left  CVA tenderness.      Hernia: No hernia is present. There is no hernia in the left inguinal area or right inguinal area.   Genitourinary:     Penis: Normal and circumcised.       Testes: Normal.   Musculoskeletal:         General: No swelling or tenderness. Normal range of motion.      Cervical back: Normal range of motion and neck supple. No muscular tenderness.   Skin:     General: Skin is warm and dry.      Capillary Refill: Capillary refill takes less than 2 seconds.      Coloration: Skin is not jaundiced.   Neurological:      General: No focal deficit present.      Mental Status: He is alert and oriented to person, place, and time. Mental status is at baseline.      Cranial Nerves: No cranial nerve deficit.      Sensory: No sensory deficit.      Motor: No weakness.      Coordination: Coordination normal.   Psychiatric:         Mood and Affect: Mood normal.         Behavior: Behavior normal.         Thought Content: Thought content normal.         Judgment: Judgment normal.        .Depression Screeninng  PHQ-2 Questions  Question 1 - Little interest:    Question 2 - Feeling down:     PHQ-2 Total Score:  0     Assessment/Plan:       1. Well adult exam  Increase vegetables, fruits, and fiber in your diet.  Exercise at least 30 minutes 5 days per week.   Always wear your seatbelt in the car.  Wear sunscreen that is broad-spectrum (UVA/UVB) and at least SPF 30.  Avoid alcohol  drinking  And driving  Goal Blood Pressure <140/90  Schedule dental exam and cleaning every 6 months  Have your vision checked every 1-2 years.       - Hemoglobin A1C; Future  - Comprehensive metabolic panel; Future  - CBC without differential; Future  - Lipid panel; Future  - TSH; Future    2. Encounter to establish care  3. Negative depression screening       Body mass index is 29.02 kg/m.   Discussed the patient's BMI with him.  The BMI is in the acceptable range.    Nutrition Counseling:   Food education, guidance and  counseling  Physical Activity Counseling  Strength training exercise promotion     Patient verbalized understanding & agreed to the treatment plan. Patient encouraged to contact me/clinical staff with any questions/concerns    Return if symptoms worsen or fail to improve.    Talmage Nap, DO

## 2020-06-09 ENCOUNTER — Other Ambulatory Visit (FREE_STANDING_LABORATORY_FACILITY): Payer: BLUE CROSS/BLUE SHIELD

## 2020-06-09 DIAGNOSIS — Z0000 Encounter for general adult medical examination without abnormal findings: Secondary | ICD-10-CM

## 2020-06-09 LAB — CBC
Absolute NRBC: 0 10*3/uL (ref 0.00–0.00)
Hematocrit: 47.9 % (ref 37.6–49.6)
Hgb: 15.5 g/dL (ref 12.5–17.1)
MCH: 29.6 pg (ref 25.1–33.5)
MCHC: 32.4 g/dL (ref 31.5–35.8)
MCV: 91.6 fL (ref 78.0–96.0)
MPV: 12.9 fL — ABNORMAL HIGH (ref 8.9–12.5)
Nucleated RBC: 0 /100 WBC (ref 0.0–0.0)
Platelets: 177 10*3/uL (ref 142–346)
RBC: 5.23 10*6/uL (ref 4.20–5.90)
RDW: 12 % (ref 11–15)
WBC: 3.52 10*3/uL (ref 3.10–9.50)

## 2020-06-09 LAB — COMPREHENSIVE METABOLIC PANEL
ALT: 50 U/L (ref 0–55)
AST (SGOT): 50 U/L — ABNORMAL HIGH (ref 5–34)
Albumin/Globulin Ratio: 1.3 (ref 0.9–2.2)
Albumin: 4.2 g/dL (ref 3.5–5.0)
Alkaline Phosphatase: 62 U/L (ref 37–117)
Anion Gap: 9 (ref 5.0–15.0)
BUN: 24 mg/dL (ref 9.0–28.0)
Bilirubin, Total: 0.5 mg/dL (ref 0.2–1.2)
CO2: 29 mEq/L (ref 21–29)
Calcium: 10 mg/dL (ref 8.5–10.5)
Chloride: 103 mEq/L (ref 100–111)
Creatinine: 1.3 mg/dL (ref 0.5–1.5)
Globulin: 3.2 g/dL (ref 2.0–3.7)
Glucose: 100 mg/dL (ref 70–100)
Potassium: 4.1 mEq/L (ref 3.5–5.1)
Protein, Total: 7.4 g/dL (ref 6.0–8.3)
Sodium: 141 mEq/L (ref 136–145)

## 2020-06-09 LAB — LIPID PANEL
Cholesterol / HDL Ratio: 3.7
Cholesterol: 182 mg/dL (ref 0–199)
HDL: 49 mg/dL (ref 40–9999)
LDL Calculated: 127 mg/dL — ABNORMAL HIGH (ref 0–99)
Triglycerides: 32 mg/dL — ABNORMAL LOW (ref 34–149)
VLDL Calculated: 6 mg/dL — ABNORMAL LOW (ref 10–40)

## 2020-06-09 LAB — TSH: TSH: 1.34 u[IU]/mL (ref 0.35–4.94)

## 2020-06-09 LAB — GFR: EGFR: >60

## 2020-06-09 LAB — HEMOGLOBIN A1C
Average Estimated Glucose: 119.8 mg/dL
Hemoglobin A1C: 5.8 % (ref 4.6–5.9)

## 2020-06-09 LAB — HEMOLYSIS INDEX: Hemolysis Index: 8 (ref 0–24)

## 2020-06-15 NOTE — Progress Notes (Signed)
A1C (3 month measure of glucose control) is within prediabetic. Need to improve diet and recheck in 6 months.  CBC (complete blood count) is within acceptable limits.  There is no evidence of anemia.  White blood cell count and platelet count are within normal limits.  CMP (comprehensive metabolic panel): liver function tests (ALT or AST) are elevated. This elevation is usually due to medical conditions (such as fatty liver) or medications (such as statins); improve diet and increase exercise and recheck in 3 months  Kidney function, electrolytes, and glucose are within acceptable limits.    TSH (thyroid function) is within acceptable limits.   Total cholesterol is not elevated; LDL (bad cholesterol) is elevated; HDL (good cholesterol) is normal; Triglycerides are not elevated  -improve diet   -recheck in 1 year

## 2020-06-26 ENCOUNTER — Telehealth (INDEPENDENT_AMBULATORY_CARE_PROVIDER_SITE_OTHER): Payer: BLUE CROSS/BLUE SHIELD | Admitting: Family Nurse Practitioner

## 2020-06-26 ENCOUNTER — Ambulatory Visit (FREE_STANDING_LABORATORY_FACILITY): Payer: BLUE CROSS/BLUE SHIELD

## 2020-06-26 ENCOUNTER — Encounter (INDEPENDENT_AMBULATORY_CARE_PROVIDER_SITE_OTHER): Payer: Self-pay | Admitting: Family Nurse Practitioner

## 2020-06-26 DIAGNOSIS — J069 Acute upper respiratory infection, unspecified: Secondary | ICD-10-CM

## 2020-06-26 LAB — IHS AMB POCT SOFIA (TM) COVID-19 & FLU A/B
Sofia Influenza A Ag POCT: NEGATIVE
Sofia Influenza B Ag POCT: NEGATIVE
Sofia SARS COV2 Antigen POCT: NEGATIVE

## 2020-06-26 NOTE — Progress Notes (Signed)
Subjective:      Patient ID: Benjamin Keith is a 40 y.o. male     Chief Complaint   Patient presents with   . URI     Reports last night had sore throat, congestion, and bodyaches        Verbal consent has been obtained from the patient to conduct a video and telephone visit to minimize exposure to COVID-19: yes     URI   This is a new problem. The current episode started yesterday. The problem has been gradually worsening. There has been no fever. Associated symptoms include congestion, headaches and a sore throat. Pertinent negatives include no coughing, diarrhea, nausea or vomiting. He has tried decongestant for the symptoms.          The following sections were reviewed this encounter by the provider:   Tobacco  Allergies  Meds  Problems  Med Hx  Surg Hx  Fam Hx         Review of Systems   Constitutional: Positive for fatigue. Negative for fever.   HENT: Positive for congestion and sore throat.    Respiratory: Positive for shortness of breath (going up stairs). Negative for cough.    Gastrointestinal: Negative for diarrhea, nausea and vomiting.   Musculoskeletal: Positive for myalgias.   Neurological: Positive for headaches.          There were no vitals taken for this visit.    Objective:   The following physical exam is limited due to the current pandemic   Physical Exam  Constitutional:       Appearance: Normal appearance. He is well-groomed. He is not ill-appearing.   HENT:      Mouth/Throat:      Lips: Pink.   Eyes:      Conjunctiva/sclera: Conjunctivae normal.   Pulmonary:      Effort: Pulmonary effort is normal.   Skin:     Coloration: Skin is not cyanotic or pale.   Neurological:      Mental Status: He is alert.   Psychiatric:         Mood and Affect: Mood normal.         Behavior: Behavior normal. Behavior is cooperative.          Assessment/Plan     1. Upper respiratory tract infection, unspecified type  New  - Sofia(TM) SARS COVID19 & Flu A/B POCT; Future  - COVID-19 (SARS-CoV-2);  Future    Recommend COVID testing as above, reviewed testing process  Recommend self-quarantine at home per current CDC guidelines  Recommend rest, fluids, hand hygiene  Recommend OTC acetaminophen prn  Continue symptomatic treatment with OTC medications  Discussed s/sx that warrant immediate follow up  Patient in agreement with above plan           Leticia Clas, FNP

## 2020-06-26 NOTE — Progress Notes (Signed)
Have you seen any specialists/other providers since your last visit with us?    No    Arm preference verified?   No    The patient is due for influenza vaccine

## 2020-06-26 NOTE — Progress Notes (Signed)
PCR/RAPID Collected

## 2020-06-27 LAB — COVID-19 (SARS-COV-2): SARS CoV 2 Overall Result: NOT DETECTED

## 2020-06-29 ENCOUNTER — Telehealth (INDEPENDENT_AMBULATORY_CARE_PROVIDER_SITE_OTHER): Payer: BLUE CROSS/BLUE SHIELD | Admitting: Family

## 2021-01-25 ENCOUNTER — Other Ambulatory Visit (INDEPENDENT_AMBULATORY_CARE_PROVIDER_SITE_OTHER): Payer: Self-pay | Admitting: Family Medicine

## 2021-06-23 ENCOUNTER — Emergency Department: Payer: BLUE CROSS/BLUE SHIELD

## 2021-06-23 ENCOUNTER — Emergency Department
Admission: EM | Admit: 2021-06-23 | Discharge: 2021-06-23 | Disposition: A | Discharge status: Home / Self Care | Payer: BLUE CROSS/BLUE SHIELD | Attending: Emergency Medicine | Admitting: Emergency Medicine

## 2021-06-23 DIAGNOSIS — X500XXA Overexertion from strenuous movement or load, initial encounter: Secondary | ICD-10-CM | POA: Insufficient documentation

## 2021-06-23 DIAGNOSIS — S46312A Strain of muscle, fascia and tendon of triceps, left arm, initial encounter: Secondary | ICD-10-CM | POA: Insufficient documentation

## 2021-06-23 DIAGNOSIS — S46309A Unspecified injury of muscle, fascia and tendon of triceps, unspecified arm, initial encounter: Secondary | ICD-10-CM

## 2021-06-23 MED ORDER — IBUPROFEN 400 MG PO TABS
800.0000 mg | ORAL_TABLET | Freq: Once | ORAL | Status: AC
Start: 2021-06-23 — End: 2021-06-23
  Administered 2021-06-23: 800 mg via ORAL
  Filled 2021-06-23: qty 2

## 2021-06-23 NOTE — ED Triage Notes (Signed)
States at approximately  was lifting weights when left tricep tore. Deformity to back of left arm. Patient only able to bend elbow degrees. States has some tingling in fingertips. Respirations even and unlabored. Skin is warm and dry. Speaking in full sentences. Moving all extremities. Ambulates with steady gait. AOX4.

## 2021-06-23 NOTE — ED Provider Notes (Signed)
History     Chief Complaint   Patient presents with    tricep tear     41 year old male, no past medical history, comes ED for evaluation of left tricep injury.  Patient reports PTA he was bench pressing when he felt a tearing and popping sensation in his left posterior arm, now complains of pain around the region just above his elbow.  Pain is worse with trying to extend the arm, better at rest.  Nothing OTC for pain.    Denies previous fracture or surgery to the left upper extremity.  Patient is right-handed.       History reviewed. No pertinent past medical history.    Past Surgical History:   Procedure Laterality Date    APPENDECTOMY (OPEN)      HERNIA REPAIR         History reviewed. No pertinent family history.    Social  Social History     Tobacco Use    Smoking status: Never    Smokeless tobacco: Never   Vaping Use    Vaping Use: Never used   Substance Use Topics    Alcohol use: Never    Drug use: Never       .     No Known Allergies    Home Medications       Med List Status: In Progress Set By: Ranae Palms, RN at 06/23/2021  2:48 PM              doxycycline (ORACEA) 40 MG capsule     Take 40 mg by mouth every morning             Review of Systems   Musculoskeletal:  Positive for joint swelling and myalgias (L tricep). Negative for arthralgias.   Skin:  Negative for color change and wound.   Neurological:  Negative for weakness and numbness.     Physical Exam    BP: (!) 157/110, Heart Rate: 66, Temp: 98.4 F (36.9 C), Resp Rate: 18, SpO2: 100 %, Weight: 96.4 kg    Physical Exam  Vitals and nursing note reviewed.   Constitutional:       General: He is not in acute distress.     Appearance: Normal appearance. He is normal weight. He is not ill-appearing, toxic-appearing or diaphoretic.   Cardiovascular:      Rate and Rhythm: Normal rate and regular rhythm.      Pulses: Normal pulses.      Heart sounds: Normal heart sounds. No murmur heard.    No gallop.      Comments: Radial pulses 2+  bilaterally.  Pulmonary:      Effort: Pulmonary effort is normal. No respiratory distress.      Breath sounds: Normal breath sounds.   Musculoskeletal:         General: Swelling and tenderness (Left triceps TTP distally, mild tenderness over olecranon.) present.      Comments: Extension of the elbow limited due to pain.  But partial extension is intact.   Skin:     General: Skin is warm and dry.      Capillary Refill: Capillary refill takes less than 2 seconds.      Findings: No bruising.   Neurological:      Mental Status: He is alert.      Sensory: No sensory deficit.   Psychiatric:         Mood and Affect: Mood normal.  Behavior: Behavior normal.         MDM and ED Course     ED Medication Orders (From admission, onward)      Start Ordered     Status Ordering Provider    06/23/21 1546 06/23/21 1545  ibuprofen (ADVIL) tablet 800 mg  Once        Route: Oral  Ordered Dose: 800 mg     Last MAR action: Given Bluford Sedler, 65 D               Medical Decision Making  41 year old maleTTE to ED for evaluation of left tricep injury that occurred PTA while bench pressing.  Likely triceps tendon injury.  Will evaluate with x-rays to rule out avulsion injury.  Provide pain medication.    XR: IMPRESSION:   2 cm ossification posterior to left elbow and 1.5 cm proximal to the  olecranon compatible with a fractured enthesophyte which can be  associated with triceps tendon injury.    X-ray showing fractured enthesophyte.  Likely partial tendon rupture given he still has partial range of motion with extension.  Will provide sling, advised follow-up with orthopedics.  Advised ice, NSAIDs, rest.  Patient agreeable.  Given return to ER precautions.    Amount and/or Complexity of Data Reviewed  Radiology: ordered.    Risk  Prescription drug management.                   Procedures    Clinical Impression & Disposition     Clinical Impression  Final diagnoses:   Triceps tendon rupture, left, initial encounter   Injury of triceps         ED Disposition       ED Disposition   Discharge    Condition   --    Date/Time   Sat Jun 23, 2021  5:59 PM    Comment   Micholas Drumwright III discharge to home/self care.    Condition at disposition: Stable                  New Prescriptions    No medications on file                   Rosendo Gros, [place]  06/23/21 1804       Donalynn Furlong, MD  06/26/21 [phone]

## 2021-06-23 NOTE — Discharge Instructions (Signed)
Dear Benjamin DimesBenjamin Franklin Wooden III,    You were seen today by Ouida Sillsoni Sola Margolis, PA-C. Thank you for choosing the [facility] Emergency Department for your healthcare needs.  We hope your visit today was EXCELLENT.    Return to ER if you develop severe pain.   Call Monday to schedule follow up with orthopedics.   Recommend motrin 600mg  every 6 hours as needed for pain.   May also take Tylenol 650mg  every 4 hours as needed for pain.   May apply ice to the area 20 mins 3-4x per day.   Wear sling until seen by ortho. May remove to shower.     Please take any medications prescribed as directed.   Return to the emergency department for any new or worsening symptoms.    If you have any questions or concerns, I am available at [phone]. Please do not hesitate to contact me if I can be of assistance.     Below is some information and resources that our patients often find helpful.    Sincerely,    Ouida Sillsoni Yui Mulvaney, Physician Assistant  [facility] - Department of Emergency Medicine    ________________________________________________________________      Thank you for choosing [facility] for your emergency care needs.  We strive to provide EXCELLENT care to you and your family.      DOCTOR REFERRALS  Call [phone] if you need any further referrals and we can help you find a primary care doctor or specialist.  Also, available online at:  [email or web address]    YOUR CONTACT INFORMATION  Before leaving please check with registration to make sure we have an up-to-date contact number.  You can call registration at [phone] to update your information.  For questions about your hospital bill, please call [phone].  For questions about your Emergency Dept Physician bill please call [phone].      FREE HEALTH SERVICES  If you need help with health or social services, please call 2-1-1 for a free referral to resources in your area.  2-1-1 is a free service  connecting people with information on health insurance, free clinics, pregnancy, mental health, dental care, food assistance, housing, and substance abuse counseling.  Also, available online at:  [email or web address]    MEDICAL RECORDS AND TESTS  Certain laboratory test results do not come back the same day, for example urine cultures.  We will contact you if other important findings are noted.  Radiology films are often reviewed again to ensure accuracy.  If there is any discrepancy, we will notify you.      Please call [phone] to pick up a complimentary CD of any radiology studies performed.  If you or your doctor would like to request a copy of your medical records, please call [phone].      ORTHOPEDIC INJURY   Please know that significant injuries can exist even when an initial x-ray is read as normal or negative.  This can occur because some fractures (broken bones) are not initially visible on x-rays.  For this reason, close outpatient follow-up with your primary care doctor or bone specialist (orthopedist) is required.    MEDICATIONS AND FOLLOWUP  Please be aware that some prescription medications can cause drowsiness.  Use caution when driving or operating machinery.    The examination and treatment you have received in our Emergency Department is provided on an emergency basis, and is not intended to be a substitute  for your primary care physician.  It is important that your doctor checks you again and that you report any new or remaining problems at that time.      LOCAL PHARMACIES  [facility] - [address], [place], [place] [address] (1.4 miles, 7 minutes)  [facility] - [address], [place], [place] [address] (6.5 miles, 13 minutes)  Handout with directions available on request    PATIENT RELATIONS  If you have any concerns, issues, or feedback related to your care, positive or negative, please do not hesitate to contact Patient Relations at [phone]. They are open from 8:30AM-5:00PM Monday  through Friday.

## 2021-06-26 ENCOUNTER — Encounter (INDEPENDENT_AMBULATORY_CARE_PROVIDER_SITE_OTHER): Payer: Self-pay | Admitting: Orthopaedic Surgery

## 2021-06-26 ENCOUNTER — Encounter (INDEPENDENT_AMBULATORY_CARE_PROVIDER_SITE_OTHER): Payer: BLUE CROSS/BLUE SHIELD | Admitting: Orthopaedic Surgery

## 2021-06-26 ENCOUNTER — Ambulatory Visit (INDEPENDENT_AMBULATORY_CARE_PROVIDER_SITE_OTHER): Payer: BLUE CROSS/BLUE SHIELD | Admitting: Orthopaedic Surgery

## 2021-06-26 VITALS — BP 132/82 | HR 69 | Ht 71.0 in | Wt 209.0 lb

## 2021-06-26 DIAGNOSIS — S46312A Strain of muscle, fascia and tendon of triceps, left arm, initial encounter: Secondary | ICD-10-CM

## 2021-06-26 NOTE — Progress Notes (Signed)
[facility]    Provider: Hermelinda Dellen, MD  Date of Exam:  06/26/2021   Patient:  Benjamin Keith  DOB:  10-Aug-1980    AGE:  41 y.o.  MR#:  [id number]     Chief Complaint: Left elbow pain.    HPI:  Benjamin Keith is a pleasant 41 y.o.-year-old male who presents today with a history of left elbow pain that he has had for the last 4 days after an injury that occurred while bench pressing.  He notes that he was bench pressing 360 lbs and while pushing the weight up he felt a "popping" sensation to the back of his left elbow.  This was accompanied by pain and an inability to finish pushing the weight off.  He was evaluated in the ED after this event where he xrays that demonstrated an acute fracture of left olecranon enthesophyte concerning for distal triceps tendon tear. he rates the pain at a 3/10 on today's visit. He localizes the pain to the posterior left elbow.  Benjamin Keith is now here for further evaluation and discussion of treatment options.    For other past medical history, social history, family history and past surgical history please see them listed below.    Club/School/Work Affiliation:     Problem List: There is no problem list on file for this patient.       Past Medical History: History reviewed. No pertinent past medical history.    Social History:   Social History     Tobacco Use    Smoking status: Never     Passive exposure: Never    Smokeless tobacco: Never   Vaping Use    Vaping Use: Never used   Substance Use Topics    Alcohol use: Never    Drug use: Never       Family History: History reviewed. No pertinent family history.    Past Surgical History:   Past Surgical History:   Procedure Laterality Date    APPENDECTOMY (OPEN)      HERNIA REPAIR         Medications: Scheduled Meds:  Continuous Infusions:  PRN Meds:.     Allergies: No Known Allergies    ROS:  Constitutional: No fatigue, fever, weight loss, or weight gain.   Ears, Nose, Mouth & Throat: No sore  throat or hearing loss.   Cardiovascular: No chest pain, blood clots, or leg cramps.   Respiratory: No shortness of breath, cough, or difficulty breathing.   Gastrointestinal: No nausea, vomiting, diarrhea, or loss of appetite.   Genitourinary: No polyuria or kidney disease.   Musculoskeletal: No joint aches, muscle weakness or swelling of joints/body parts other than that mentioned above.  Integumentary: No finger nail changes or skin dryness.   Neurological: No numbness, burning discomfort, or headaches.   Psychiatric: No depression or anxiety.   Endocrine: No increased thirst, change in appetite or thyroid disease.   Hematologic/Lymphatic: No easy bruising or anemia.     Exam:   Left Elbow Exam:  Skin Intact  Erythema None present  Swelling None present  Atrophy No identifiable  TTP at Medial epicondyle: None  TTP at Lateral epicondyle: None  TTP Olecranon: Positive  Palpable defect at distal triceps tendon insertion superior to olecranon  Weakness and pain with resisted triceps push down.     ROM: 5-145  Strength: WNL    Pain with resisted wrist extension: Negative  Pain with resisted finger extension: Negative  Pain with resisted supination: Negative    Pain with resisted wrist flexion: Negative  Pain with resisted finger flexion: Negative  Pain with resisted pronation: Negative    Ulnar tinnel sign: Negative  Terminal flexion sign: Negative    Radial Pulse 2+  DTR and Pathological Reflexes Intact  No lymphadenopathy  Radial/Median/Ulnar Nerves intact to motor and sensory provocation  Hand Perfused with CR <2 sec    Right Elbow Exam:  Skin Intact  Erythema None present  Swelling None present  Atrophy No identifiable  TTP at Medial epicondyle: None  TTP at Lateral epicondyle: None  TTP Olecranon: None  Other Areas of TTP None    ROM: 0-145  Strength: WNL    Pain with resisted wrist extension: Negative  Pain with resisted finger extension: Negative  Pain with resisted supination: Negative    Pain with resisted  wrist flexion: Negative  Pain with resisted finger flexion: Negative  Pain with resisted pronation: Negative    Ulnar tinnel sign: Negative  Terminal flexion sign: Negative    Radial Pulse 2+  DTR and Pathological Reflexes Intact  No lymphadenopathy  Radial/Median/Ulnar Nerves intact to motor and sensory provocation  Hand Perfused with CR <2 sec    Vitals: BP 132/82   Pulse 69   Ht 1.803 m (5\' 11" )   Wt 94.8 kg (209 lb)   BMI 29.15 kg/m     General: Benjamin Keith was awake, alert, oriented, easily engaged, displayed logical thinking with clear speech and was neat in appearance. His general appearance was normal, well-developed and well-nourished.    Gait: The patient demonstrated a non-antalgic gait with intact coordination and balance.     Studies: AP and lateral views of the left elbow were reviewed on today's visit from 06/23/2021. There are no signs of osteoarthritis. There are no soft tissue calcifications. There is no evidence of a posterior fat pad sign. There are no sclerotic and cystic changes.  Evidence of acute fracture of olecranon enthesophyte, concerning for distal triceps tendon rupture.    Assessment/Plan: Benjamin Keith is a pleasant 41 y.o. male with historical, clinical and xray evidence of left distal triceps tendon rupture.  The patient's diagnosis and treatment options were discussed in detail at today's visit.  He notes an acute traumatic event in which is was bench pressing when he felt a popping painful sensation to his posterior left elbow.  He noted swelling after the injury.  He was evaluated in the ED where he had xrays that demonstrated an acute fracture of olecranon enthesophyte.  On exam he has ttp at left olecranon process with a palpable defect of the distal triceps tendon.  He has pain and significant weakness with resisted tricep push down.  His exam is consistent with an acute left distal triceps tendon rupture. I have asked the patient to avoid activities that exacerbate or worsen his  symptoms. We have discussed surgical intervention with the associated risks, benefits, alternatives and rehabilitation protocol for a left distal triceps tendon repair.  After a lengthy discussion he is willing to proceed with the above discussed surgical plan. The patient will notify my clinic of any changes or worsening of their symptoms during the interim.    Elements of MDM:   1. Number and Complexity of Problems Addressed: 1 or more Acute/Chronic with risk to bodily function    2. Data Reviewed: (need 2 categories)  A) Independent Radiology interpretation: Yes XR left elbow    B) (need 3):   New Radiology/Labs  ordered: No  Outside Radiology reports/Labs reviewed: Yes XR left elbow  Involve Independent historian: No  External provider notes reviewed: Yes ED    C) External provider discussion: No    3. Risk of Problem/Management: High risk from additional diagnostic testing or treatment  (need 1)  Urgent surgery/hospitalization discussed: No   Major elective surgery with risk factors discussed: Yes left distal triceps tendon repair    --------------------------------------------------    Level of Medical Decision Making: High - Level 5 Visit  (Need 2 of 3 categories above)     The review of the patient's medications does not in any way constitute an endorsement, by this clinician,  of their use, dosage, indications, route, efficacy, interactions, or other clinical parameters.    This note may have been generated in part or in whole within the [employer] EMR using Dragon medical speech recognition software and may contain inherent errors or omissions not intended by the user. Grammatical and punctuation errors, random word insertions, deletions, pronoun errors and incomplete sentences are occasional consequences of this technology due to software limitations. Not all errors are caught or corrected.  Although every attempt is made to root out erroneus and incomplete transcription, the note may still not fully represent  the intent or opinion of the author. If there are questions or concerns about the content of this note or information contained within the body of this dictation they should be addressed directly with the author for clarification.     I, Hermelinda Dellen, MD, have personally performed the services documented. I have performed the history, physical exam and medical decision making; and confirmed the accuracy of the information in the note as entered by Harvest Forest, PA, acting as my scribe.    Date: 06/26/2021 Time: 12:55 PM

## 2021-06-28 ENCOUNTER — Ambulatory Visit (AMBULATORY_SURGERY_CENTER): Payer: BLUE CROSS/BLUE SHIELD | Admitting: Orthopaedic Surgery

## 2021-06-28 DIAGNOSIS — S46312A Strain of muscle, fascia and tendon of triceps, left arm, initial encounter: Secondary | ICD-10-CM

## 2021-06-28 HISTORY — PX: REINSERTION, RUPTURED TRICEPS TENDON, DISTAL: SHX5326

## 2021-06-28 MED ORDER — OXYCODONE HCL 5 MG PO TABS
5.0000 mg | ORAL_TABLET | ORAL | 0 refills | Status: DC | PRN
Start: 2021-06-28 — End: 2021-07-13

## 2021-06-28 MED ORDER — ONDANSETRON 4 MG PO TBDP
4.0000 mg | ORAL_TABLET | Freq: Three times a day (TID) | ORAL | 0 refills | Status: DC | PRN
Start: 2021-06-28 — End: 2021-07-13

## 2021-06-28 MED ORDER — OXYCODONE HCL 5 MG PO TABS
5.0000 mg | ORAL_TABLET | ORAL | 0 refills | Status: DC | PRN
Start: 2021-06-28 — End: 2021-06-28

## 2021-07-02 ENCOUNTER — Inpatient Hospital Stay: Payer: BLUE CROSS/BLUE SHIELD | Attending: Physician Assistant | Admitting: Physical Therapist

## 2021-07-02 VITALS — BP 147/97 | HR 73

## 2021-07-02 DIAGNOSIS — M25522 Pain in left elbow: Secondary | ICD-10-CM | POA: Insufficient documentation

## 2021-07-02 NOTE — Progress Notes (Signed)
Name:Benjamin Roby LoftsFranklin Zavaleta Keith Age: 41 y.o.   Date of Service: 07/02/2021  Referring Physician: Harvest ForestMcDonough, Sean, PA   Date of Injury: 06/23/2021  Date Care Plan Established/Reviewed: 07/02/2021  Date Treatment Started: 07/02/2021  Date Care Plan Established/Reviewed No data was found  Date Treatment Started No data was found  (Historic) Date Care Plan Established/Reviewed No data was found  (Historic) Date Treatment Started No data was found   End of Certification Date: 09/29/2021  Sessions in Plan of Care: 16  Surgery Date: 06/28/2021  MD Follow-up: No data was found  Medbridge Code: No data was found    Visit Count: 1   Diagnosis:   1. Left elbow pain        Subjective     History of Present Illness   History of Present Illness: Pt presents s/p L distal triceps tendon repair on 06/28/21 following injury while bench pressing 360 lbs. Since surgery he has noticed mild swelling in the L arm and into the hand. He is able to sleep in a chair comfortably but is unable to sleep comfortable in supine. He describes achy pains in the posterior shoulder that wake him up while sleeping. Easing factors include Ibuprofen and tylenol with good relief. He has also uses ice for the swelling. Pt denies numbness and tingling. He states he had a nerve block during surgery which limited his shoulder abduction AROM. The nerve block has since worn off and he can now move his L arm into abduction without difficulty. Pt states that he is able to perform most activities of daily life such as opening doors, showering, and tying his shoes without difficulty. He states he has been compliant with the protocol. Dr. Beverely PaceBryant wanted PT to focus on getting full elbow extension and progressing gradually with flexion. The pt's goals for PT are to return to work and maintain healthy lifestyle, which including returning to lifting weights.     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    No past medical history on file.    Objective     Integumentary   surgical incisions healing without signs of infection    Active Range of Motion (degrees)     Left Elbow   Flexion: 125 degrees   Extension: 0 degrees   Forearm supination: 90 degrees   Forearm pronation: 90 degrees     Right Elbow   Flexion: 68 degrees     Passive Range of Motion (degrees)      Right Elbow   Flexion: 73 degrees   Extension: -12 degrees   Forearm supination: 90 degrees   Forearm pronation: 90 degrees     Additional Passive Range of Motion Details  Forearm supination and pronation measured in full elbow extension     Strength/Myotome Testing (/5)  Additional Strength Details  Not assessed secondary to being post-op evaluation     Neurological Testing     Sensation     Elbow   Left Elbow   Intact: light touch    Right Elbow   Intact: light touch    Wrist/Hand   Left   Intact: light touch    Right   Intact: light touch            Noted swelling throughout left hand  Swelling:  L:  At elbow:34.1 cm  10 cm above elbow: 37.7 cm  10 cm below elbow: 32.0  R:  At elbow:32.2 cm  10 cm above elbow: 38.0 cm  10 cm below elbow: 30.5 cm   BP: (!) 147/97 Heart Rate: 73    Treatment     Therapeutic Exercises - Justified to address any of the following:  To develop strength, endurance, ROM and/or flexibility.   Educated pt on diagnosis, prognosis, anatomy, POC    Educated pt on monitoring for signs of infection     Pt educated  on elevated BP reading today and impact of pain on BP       Elbow flexion and extension PROM within protocol restrictions     Pt performed and instructed on hand pumping throughout the day for edema control and improving grip strength     Manual Therapy - Justified to address any of the following:    Mobilization of joints and soft tissues, manipulation, manual lymphatic drainage, and/or manual traction.    Edema massage to R UE in supine with head of bed elevated     ---      Flowsheet Row ---   Total Time    Timed Minutes 20 minutes   Untimed Minutes 27 minutes   Total Time 47 minutes          Assessment   [name] is a 41 y.o. male presenting s/p L distal triceps repair on 06/28/21 who requires skilled Physical Therapy services.    Clinical presentation: stable - normal post-operative recovery (no infections/incisions healing/average swelling)  Barriers to therapy: Occupational requirements - required to lift heavy weights    Impairments: Decreased range of motion  Decreased strength  Swelling/Edema/Effusion  Decreased soft tissue mobility  Functional Limitations (PLOF): Unable to lift 360 lbs (Previously able to lift 360 lbs without pain or difficulty)  Unable to perform work duties (Previously able to perform all work duties without pain or difficulty)   Unable to sleep supine (Pt previously able to sleep supine for 8 hours)  Prognosis: excellent  Patient is aware of diagnosis, prognosis and consents to plan of care: Yes  Plan   Visits per week: 2  Number of Sessions: 16  Direct One on One  16109: Therapeutic Exercise: To Develop Strength and Endurance, ROM and Flexibility  [phone]: Neuromuscular Reeducation (Proprioceptive Neuromuscular Faciliation)  97140: Manual Therapy techniques (mobilization, manipulation, manual traction) (Grade I-V to elbow, shoulder, thoracic spine, cervical spine, and regionally interdependent joints, soft tissue mobilization, instrument assisted soft tissue mobilization.)  97530:  Therapeutic Activities: Dynamic activities to improve functional performance  Dry Needling  Supervised Modalities  97010: Thermal modalities: hot/cold packs  09811: Mechnical traction  97014: Electrical stimulation  97016: Vasopneumatic devices  Progress the brace to 60 degrees of flexion later this week per Dr. Beverely Pace. Reassess BP next visit.    Goals      Goal 1: Improve FOTO score from 46 to 74 or better to demonstrate change  for significant functional improvement.      Sessions: 16      Goal 2: Patient will demonstrate independence in prescribed HEP with proper form, sets and reps for safe discharge to an independent program.   Sessions: 16      Goal 3: Patient will demonstrate elbow flexion AROM of greater than or equal to 125 degrees to allow patient to don and doff shirts without pain or compensation.    Sessions: 16      Goal 4: Patient will demonstrate elbow flexion strength to 90% of uninvolved limb to allow patient to return to PLOF with weightlifting.    Sessions: 16          Goal 5: Patient will demonstrate elbow extension strength to 90% of uninvolved side to allow patient to return to full work duties without pain or compensation.   Sessions: 16      Goal 6: Patient will demonstrate elbow extension AROM of greater than or equal to 0 degrees to allow patient to push up from a chair without pain.      Sessions: [address] [place]    Today's treatment session was completed under the direct 1:1 supervision of the Licensed Physical Therapist. The Licensed Physical Therapist was present and in the room for the entire session and was fully responsible for the therapy assessment and treatment interventions while the student participated/provided services being performed. Documentation has been reviewed and approved by the Licensed Physical Therapist via co-signature.

## 2021-07-05 ENCOUNTER — Inpatient Hospital Stay
Payer: BLUE CROSS/BLUE SHIELD | Attending: Physician Assistant | Admitting: Rehabilitative and Restorative Service Providers"

## 2021-07-05 DIAGNOSIS — M25522 Pain in left elbow: Secondary | ICD-10-CM | POA: Insufficient documentation

## 2021-07-05 NOTE — PT/OT Therapy Note (Signed)
Name: Domingo DimesBenjamin Franklin Kluge III Age: 41 y.o.   Date of Service: 07/05/2021  Referring Physician: Harvest ForestMcDonough, Sean, PA   Date of Injury: 06/23/2021  Date Care Plan Established/Reviewed: 07/02/2021  Date Treatment Started: 07/02/2021  Date Care Plan Established/Reviewed No data was found  Date Treatment Started No data was found  (Historic) Date Care Plan Established/Reviewed No data was found  (Historic) Date Treatment Started No data was found   End of Certification Date: 09/29/2021  Sessions in Plan of Care: 16  Surgery Date: 06/28/2021  MD Follow-up: No data was found  Medbridge Code: No data was found    Visit Count: 2   Diagnosis:   1. Left elbow pain        Subjective     Daily Subjective   Pt states no issues since last visit. States that he had no adverse issues from the eval.     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    Objective                   Treatment     Therapeutic Exercises - Justified to address any of the following:  To develop strength, endurance, ROM and/or flexibility.   Bicep stretch at table 3x30sec  Instructed to perfrom for HEP at home  Adjusted brace to full ext and to 60 deg flexion    Manual Therapy - Justified to address any of the following:    Mobilization of joints and soft tissues, manipulation, manual lymphatic drainage, and/or manual traction.    STM biceps, tricep, wrist flex, wrist ext, pronator teres, supinator  Effleurage to the elbow     ---      Flowsheet Row ---   Total Time    Timed Minutes 43 minutes   Total Time 43 minutes          Assessment   Significant soft tissue tightness along the wrist flexors and biceps. Was able to get close to full extension of the elbow by the end. Adjusted brace and advised pt to work on biceps stretch at home.   Plan   Continue per POC, f/u on elbow extension    Goals       Goal 1: Improve FOTO score from 46 to 74 or better to demonstrate change for significant functional improvement.      Sessions: 16      Goal 2: Patient will demonstrate independence in prescribed HEP with proper form, sets and reps for safe discharge to an independent program.   Sessions: 16      Goal 3: Patient will demonstrate elbow flexion AROM of greater than or equal to 125 degrees to allow patient to don and doff shirts without pain or compensation.    Sessions: 16      Goal 4: Patient will demonstrate elbow flexion strength to 90% of uninvolved limb to allow patient to return to PLOF with weightlifting.    Sessions: 16          Goal 5: Patient will demonstrate elbow extension strength to 90% of uninvolved side to allow patient to return to full work duties without pain or compensation.   Sessions: 16      Goal 6: Patient will demonstrate elbow extension AROM of greater than or  equal to 0 degrees to allow patient to push up from a chair without pain.      Sessions: 16                        Thora Lance, DPT

## 2021-07-09 ENCOUNTER — Inpatient Hospital Stay
Payer: BLUE CROSS/BLUE SHIELD | Attending: Physician Assistant | Admitting: Rehabilitative and Restorative Service Providers"

## 2021-07-09 DIAGNOSIS — M25522 Pain in left elbow: Secondary | ICD-10-CM | POA: Insufficient documentation

## 2021-07-09 NOTE — PT/OT Therapy Note (Signed)
Name: Domingo DimesBenjamin Franklin Mccollom III Age: 41 y.o.   Date of Service: 07/09/2021  Referring Physician: Harvest ForestMcDonough, Sean, PA   Date of Injury: 06/23/2021  Date Care Plan Established/Reviewed: 07/02/2021  Date Treatment Started: 07/02/2021  Date Care Plan Established/Reviewed No data was found  Date Treatment Started No data was found  (Historic) Date Care Plan Established/Reviewed No data was found  (Historic) Date Treatment Started No data was found   End of Certification Date: 09/29/2021  Sessions in Plan of Care: 16  Surgery Date: 06/28/2021  MD Follow-up: No data was found  Medbridge Code: No data was found    Visit Count: 3   Diagnosis:   1. Left elbow pain        Subjective     Daily Subjective   Pt states that he has been doing better. States that he is not taking much pain meds now. States that he has not much pain in the elbow but notices the bicep tightness mentioned last visit.     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    Objective                   Treatment     Therapeutic Exercises - Justified to address any of the following:  To develop strength, endurance, ROM and/or flexibility.   Performed towel squeezes with Blood Flow Restriction with Delfi system at 80% PTP on L UE.  BFR utilized for improved strength Explained all risks, benefits, and safety precautions prior to use.  Patient performed protocol of 30 reps followed by 3 sets of 15 for each exercise while being closely monitored for excess discomfort and for proper form.  Discussed nutritional needs after BFR training to maximize benefit.    Scap squeeze 10x10sec    Manual Therapy - Justified to address any of the following:    Mobilization of joints and soft tissues, manipulation, manual lymphatic drainage, and/or manual traction.    STM biceps, tricep, wrist flex, wrist ext,  pronator teres, supinator  Effleurage to the elbow  TPR biceps, wrist flexors, pronator teres     ---      Flowsheet Row ---   Total Time    Timed Minutes 41 minutes   Total Time 41 minutes          Assessment   Improved elbow extension mobility. Continues to have flexor and pronator tightness. Added BFR this visit to try and improved muscle retention.   Plan   Continue per POC, f/u on BFR    Goals      Goal 1: Improve FOTO score from 46 to 74 or better to demonstrate change for significant functional improvement.      Sessions: 16      Goal 2: Patient will demonstrate independence in prescribed HEP with proper form, sets and reps for safe discharge to an independent program.   Sessions: 16      Goal 3: Patient will demonstrate elbow flexion AROM of greater than or equal to 125 degrees to allow patient to don and doff shirts without pain or compensation.    Sessions: 16      Goal 4: Patient will demonstrate elbow flexion strength to 90% of uninvolved limb to allow patient to return to PLOF with weightlifting.  Sessions: 16          Goal 5: Patient will demonstrate elbow extension strength to 90% of uninvolved side to allow patient to return to full work duties without pain or compensation.   Sessions: 16      Goal 6: Patient will demonstrate elbow extension AROM of greater than or equal to 0 degrees to allow patient to push up from a chair without pain.      Sessions: 97                        Thora Lance, DPT

## 2021-07-11 ENCOUNTER — Encounter (INDEPENDENT_AMBULATORY_CARE_PROVIDER_SITE_OTHER): Payer: Self-pay | Admitting: Orthopaedic Surgery

## 2021-07-12 ENCOUNTER — Inpatient Hospital Stay
Payer: BLUE CROSS/BLUE SHIELD | Attending: Physician Assistant | Admitting: Rehabilitative and Restorative Service Providers"

## 2021-07-12 DIAGNOSIS — M25522 Pain in left elbow: Secondary | ICD-10-CM | POA: Insufficient documentation

## 2021-07-12 NOTE — PT/OT Therapy Note (Signed)
Name: Syair Fricker III Age: 41 y.o.   Date of Service: 07/12/2021  Referring Physician: Harvest Forest, PA   Date of Injury: 06/23/2021  Date Care Plan Established/Reviewed: 07/02/2021  Date Treatment Started: 07/02/2021  Date Care Plan Established/Reviewed No data was found  Date Treatment Started No data was found  (Historic) Date Care Plan Established/Reviewed No data was found  (Historic) Date Treatment Started No data was found   End of Certification Date: 09/29/2021  Sessions in Plan of Care: 16  Surgery Date: 06/28/2021  MD Follow-up: No data was found  Medbridge Code: No data was found    Visit Count: 4   Diagnosis:   1. Left elbow pain        Subjective     Daily Subjective   Pt states no issue after last visit. Has not been having pain. Pt asks when he can start working out.     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    Objective                   Treatment     Therapeutic Exercises - Justified to address any of the following:  To develop strength, endurance, ROM and/or flexibility.   SL ER in brace 2x10  Shoulder flexion with brace locked 2x10  Shoulder ABD braced locked 2x10  Shrugs 2x10  Prone scap retraction 2x10 on ball      Manual Therapy - Justified to address any of the following:    Mobilization of joints and soft tissues, manipulation, manual lymphatic drainage, and/or manual traction.    STM biceps, tricep, wrist flex, wrist ext, pronator teres, supinator  Effleurage to the elbow  TPR biceps, wrist flexors, pronator teres     ---      Flowsheet Row ---   Total Time    Timed Minutes 43 minutes   Total Time 43 minutes          Assessment   Advised pt that he can do lower body and trunk exercises and R UE exercises for endurance. Elbow extension continues to progress. But continues to have tightness along the  pronators and wrist flexors. Worked on scapular and shoudler exercises this visit.   Plan   Continue per POC, f/u post treat, add HEP as able    Goals      Goal 1: Improve FOTO score from 46 to 74 or better to demonstrate change for significant functional improvement.      Sessions: 16      Goal 2: Patient will demonstrate independence in prescribed HEP with proper form, sets and reps for safe discharge to an independent program.   Sessions: 16      Goal 3: Patient will demonstrate elbow flexion AROM of greater than or equal to 125 degrees to allow patient to don and doff shirts without pain or compensation.    Sessions: 16      Goal 4: Patient will demonstrate elbow flexion strength to 90% of uninvolved limb to allow patient to return to PLOF with weightlifting.    Sessions: 16          Goal 5: Patient will demonstrate elbow extension strength to 90% of uninvolved side to allow patient to return to full work duties without  pain or compensation.   Sessions: 16      Goal 6: Patient will demonstrate elbow extension AROM of greater than or equal to 0 degrees to allow patient to push up from a chair without pain.      Sessions: [id number]                        Thora Lanceaniel H Irmgard Rampersaud, DPT

## 2021-07-13 ENCOUNTER — Ambulatory Visit (INDEPENDENT_AMBULATORY_CARE_PROVIDER_SITE_OTHER): Payer: BLUE CROSS/BLUE SHIELD | Admitting: Orthopaedic Surgery

## 2021-07-13 ENCOUNTER — Encounter (INDEPENDENT_AMBULATORY_CARE_PROVIDER_SITE_OTHER): Payer: Self-pay | Admitting: Orthopaedic Surgery

## 2021-07-13 VITALS — BP 126/82 | HR 76

## 2021-07-13 DIAGNOSIS — S46312D Strain of muscle, fascia and tendon of triceps, left arm, subsequent encounter: Secondary | ICD-10-CM

## 2021-07-13 DIAGNOSIS — Z4789 Encounter for other orthopedic aftercare: Secondary | ICD-10-CM

## 2021-07-13 NOTE — Progress Notes (Signed)
[facility]       Provider: Hermelinda Dellen, MD       Date of Exam:  07/13/2021   Patient:  Benjamin Keith  DOB:  18-Feb-1981    AGE:  41 y.o.  MR#:  [id number]     Diagnosis: s/p left open distal triceps repair and augmentation. DOS: 06/28/2021    HPI: Benjamin Keith returns, 15 days status post left triceps surgery as described above. He has been compliant with all post-operative instructions to date. He has started and been attending formal physical therapy as previously prescribed.  He notes that his pain has been well controlled in the post operative period.  He has continued to wear his ROM elbow brace as instructed.  Benjamin Keith is here today for his first regularly scheduled post-operative visit.    For other past medical history, social history, family history and past surgical history please see them listed below.    Club/School/Work Affiliation:     Problem List:   Patient Active Problem List   Diagnosis    Left elbow pain       Past Medical History:  No past medical history on file.    Social History:   Social History     Tobacco Use    Smoking status: Never     Passive exposure: Never    Smokeless tobacco: Never   Vaping Use    Vaping Use: Never used   Substance Use Topics    Alcohol use: Never    Drug use: Never       Family History: No family history on file.    Past Surgical History:    Past Surgical History:   Procedure Laterality Date    APPENDECTOMY (OPEN)      HERNIA REPAIR      REPAIR, TRICEP TENDON Left 06/28/2021       Medications:  Scheduled Meds:  Continuous Infusions:  PRN Meds:.     Allergies:  No Known Allergies    ROS:  Constitutional: No fatigue, fever, weight loss, or weight gain.   Ears, Nose, Mouth & Throat: No sore throat or hearing loss.   Cardiovascular: No chest pain, blood clots, or leg cramps.   Respiratory: No shortness of breath, cough, or difficulty breathing.   Gastrointestinal: No nausea, vomiting, diarrhea, or loss of appetite.   Genitourinary: No polyuria or  kidney disease.   Musculoskeletal: No joint aches, muscle weakness or swelling of joints/body parts other than that mentioned above.  Integumentary: No finger nail changes or skin dryness.   Neurological: No numbness, burning discomfort, or headaches.   Psychiatric: No depression or anxiety.   Endocrine: No increased thirst, change in appetite or thyroid disease.   Hematologic/Lymphatic: No easy bruising or anemia.     Exam:   Left triceps Exam:  Incisions are well-healed, clean, dry and intact.  No effusion.  No swelling.  No tenderness to palpation.   ROM: 5-90  Strength: deferred  No other areas of tenderness.  DTR and Pathological Reflexes Intact  No lymphadenopathy  Sensation Intact to light touch all distributions of arm and hand  Radial/Median/Ulnar Nerves intact to motor and sensory provocation  Hand Perfused with CR <2 sec  Radial pulse 2+    Vitals: BP 126/82   Pulse 76     General: Benjamin Keith was awake, alert, oriented, easily engaged, displayed logical thinking with clear speech and was neat in appearance. His general appearance was normal, well-developed and well-nourished.  Gait: The patient demonstrated a non-antalgic gait with intact coordination and balance.    Studies: No studies obtained on today's visit.    Assessment/Plan: Benjamin Keith is doing well.  His skin incisions are healing well.  He will continue formal physical therapy and continue to wear his ROM brace and continue to increase his ROM with physical therapy. Sutures were removed and wound care instructions and precautions were given. The patient will continue physical therapy and therapeutic exercises per protocol. Patient was instructed on the dos and don'ts regarding protection of the surgical procedure, as well as appropriate activity levels going forward.  The patient will notify my clinic of any changes or worsening of their symptoms during the interim. All the patient's questions were answered appropriately and completely. We will  plan on follow up in 4 weeks. Benjamin Keith understands the plan.     The review of the patient's medications does not in any way constitute an endorsement, by this clinician,  of their use, dosage, indications, route, efficacy, interactions, or other clinical parameters.    This note may have been generated in part or in whole within the EP[employer] EMR using Dragon medical speech recognition software and may contain inherent errors or omissions not intended by the user. Grammatical and punctuation errors, random word insertions, deletions, pronoun errors and incomplete sentences are occasional consequences of this technology due to software limitations. Not all errors are caught or corrected.  Although every attempt is made to root out erroneus and incomplete transcription, the note may still not fully represent the intent or opinion of the author. If there are questions or concerns about the content of this note or information contained within the body of this dictation they should be addressed directly with the author for clarification.     I, Hermelinda DellenBrandon J Nahjae Hoeg, MD, have personally performed the services documented. I have performed the history, physical exam and medical decision making; and confirmed the accuracy of the information in the note as entered by Harvest ForestSean McDonough, PA, acting as my scribe.    Date: 07/13/2021 Time: 8:55 AM

## 2021-07-17 ENCOUNTER — Inpatient Hospital Stay
Payer: BLUE CROSS/BLUE SHIELD | Attending: Physician Assistant | Admitting: Rehabilitative and Restorative Service Providers"

## 2021-07-17 DIAGNOSIS — M25522 Pain in left elbow: Secondary | ICD-10-CM | POA: Insufficient documentation

## 2021-07-17 NOTE — PT/OT Therapy Note (Signed)
Name:Benjamin Keith Age: 41 y.o.   Date of Service: 07/17/2021  Referring Physician: Harvest Forest, PA   Date of Injury: 06/23/2021  Date Care Plan Established/Reviewed: 07/02/2021  Date Treatment Started: 07/02/2021  Date Care Plan Established/Reviewed No data was found  Date Treatment Started No data was found  (Historic) Date Care Plan Established/Reviewed No data was found  (Historic) Date Treatment Started No data was found   End of Certification Date: 09/29/2021  Sessions in Plan of Care: 16  Surgery Date: 06/28/2021  MD Follow-up: No data was found  Medbridge Code: No data was found    Visit Count: 5   Diagnosis:   1. Left elbow pain        Subjective     Daily Subjective   Pt states he saw the MD. Was pleased with progress. Pt states that he has not started doing exercises for the other body parts yet.     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    No past medical history on file.    Objective                   Treatment     Therapeutic Exercises - Justified to address any of the following:  To develop strength, endurance, ROM and/or flexibility.   Performed towel squeeze, wrist flexion with 2# with Blood Flow Restriction with Delfi system at 50% PTP on L UE.  BFR utilized for improved muscle strength Explained all risks, benefits, and safety precautions prior to use.  Patient performed protocol of 30 reps followed by 3 sets of 15 for each exercise while being closely monitored for excess discomfort and for proper form.  Discussed nutritional needs after BFR training to maximize benefit.    Manual Therapy - Justified to address any of the following:    Mobilization of joints and soft tissues, manipulation, manual lymphatic drainage, and/or manual traction.    STM biceps, tricep, wrist flex, wrist ext, pronator teres,  supinator  Effleurage to the elbow  TPR biceps, wrist flexors, pronator teres     ---      Flowsheet Row ---   Total Time    Timed Minutes 43 minutes   Total Time 43 minutes          Assessment   Continued focus on end range extension of the elbow. Continues to have medial forearm tightness. Continued use of BFR this visit to try and improve overall strength.   Plan   Continue per POC, f/u on post BFR, work on progression in elbow ext    Goals      Goal 1: Improve FOTO score from 46 to 74 or better to demonstrate change for significant functional improvement.      Sessions: 16      Goal 2: Patient will demonstrate independence in prescribed HEP with proper form, sets and reps for safe discharge to an independent program.   Sessions: 16      Goal 3: Patient will demonstrate elbow flexion AROM of greater than or equal to 125 degrees to allow patient to don and doff shirts without pain or compensation.    Sessions: 16      Goal 4: Patient will demonstrate elbow flexion strength to 90% of uninvolved limb  to allow patient to return to PLOF with weightlifting.    Sessions: 16          Goal 5: Patient will demonstrate elbow extension strength to 90% of uninvolved side to allow patient to return to full work duties without pain or compensation.   Sessions: 16      Goal 6: Patient will demonstrate elbow extension AROM of greater than or equal to 0 degrees to allow patient to push up from a chair without pain.      Sessions: 22                        Thora Lance, DPT

## 2021-07-19 ENCOUNTER — Inpatient Hospital Stay
Payer: BLUE CROSS/BLUE SHIELD | Attending: Physician Assistant | Admitting: Rehabilitative and Restorative Service Providers"

## 2021-07-19 DIAGNOSIS — M25522 Pain in left elbow: Secondary | ICD-10-CM | POA: Insufficient documentation

## 2021-07-19 NOTE — PT/OT Therapy Note (Signed)
Name: Benjamin Keith Age: 41 y.o.   Date of Service: 07/19/2021  Referring Physician: Harvest Forest, PA   Date of Injury: 06/23/2021  Date Care Plan Established/Reviewed: 07/02/2021  Date Treatment Started: 07/02/2021  Date Care Plan Established/Reviewed No data was found  Date Treatment Started No data was found  (Historic) Date Care Plan Established/Reviewed No data was found  (Historic) Date Treatment Started No data was found   End of Certification Date: 09/29/2021  Sessions in Plan of Care: 16  Surgery Date: 06/28/2021  MD Follow-up: No data was found  Medbridge Code: No data was found    Visit Count: 6   Diagnosis:   1. Left elbow pain        Subjective     Daily Subjective   States that he has been feeling good. Felt the post work out pump for the rest of the day after last visit.     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    Objective                   Treatment     Therapeutic Exercises - Justified to address any of the following:  To develop strength, endurance, ROM and/or flexibility.   Performed wrist flexion with 2# prone scap retraction with Blood Flow Restriction with Delfi system at 50% PTP on L UE.  BFR utilized for improved muscle strength Explained all risks, benefits, and safety precautions prior to use.  Patient performed protocol of 30 reps followed by 3 sets of 15 for each exercise while being closely monitored for excess discomfort and for proper form.  Discussed nutritional needs after BFR training to maximize benefit.    Manual Therapy - Justified to address any of the following:    Mobilization of joints and soft tissues, manipulation, manual lymphatic drainage, and/or manual traction.    STM biceps, tricep, wrist flex, wrist ext, pronator teres, supinator  Effleurage to the elbow  TPR biceps, wrist  flexors, pronator teres     ---      Flowsheet Row ---   Total Time    Timed Minutes 43 minutes   Total Time 43 minutes          Assessment   Continued use of BFR. Improved end range elbow extension noted this visit. Visible fatigue with prone scap retractions.   Plan   Continue per POC, continue progression per protocol     Goals      Goal 1: Improve FOTO score from 46 to 74 or better to demonstrate change for significant functional improvement.      Sessions: 16      Goal 2: Patient will demonstrate independence in prescribed HEP with proper form, sets and reps for safe discharge to an independent program.   Sessions: 16      Goal 3: Patient will demonstrate elbow flexion AROM of greater than or equal to 125 degrees to allow patient to don and doff shirts without pain or compensation.    Sessions: 16      Goal 4: Patient will demonstrate elbow flexion strength to 90% of uninvolved limb to allow patient to return to PLOF with weightlifting.    Sessions: 16          Goal  5: Patient will demonstrate elbow extension strength to 90% of uninvolved side to allow patient to return to full work duties without pain or compensation.   Sessions: 16      Goal 6: Patient will demonstrate elbow extension AROM of greater than or equal to 0 degrees to allow patient to push up from a chair without pain.      Sessions: 42                        Thora Lance, DPT

## 2021-07-25 ENCOUNTER — Inpatient Hospital Stay
Payer: BLUE CROSS/BLUE SHIELD | Attending: Physician Assistant | Admitting: Rehabilitative and Restorative Service Providers"

## 2021-07-25 DIAGNOSIS — M25522 Pain in left elbow: Secondary | ICD-10-CM | POA: Insufficient documentation

## 2021-07-25 NOTE — PT/OT Therapy Note (Unsigned)
Name: Domingo DimesBenjamin Franklin Rader III Age: 41 y.o.   Date of Service: 07/25/2021  Referring Physician: Harvest ForestMcDonough, Sean, PA   Date of Injury: 06/23/2021  Date Care Plan Established/Reviewed: 07/02/2021  Date Treatment Started: 07/02/2021  Date Care Plan Established/Reviewed No data was found  Date Treatment Started No data was found  (Historic) Date Care Plan Established/Reviewed No data was found  (Historic) Date Treatment Started No data was found   End of Certification Date: 09/29/2021  Sessions in Plan of Care: 16  Surgery Date: 06/28/2021  MD Follow-up: No data was found  Medbridge Code: No data was found    Visit Count: 7   Diagnosis:   1. Left elbow pain        Subjective     Daily Subjective   Pt states that he has been feeling very good. No increase in pain or problems. Has been doing HEP at home.     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    Objective                   Treatment     Therapeutic Exercises - Justified to address any of the following:  To develop strength, endurance, ROM and/or flexibility.   Performed green finger web, SL ER, prone scap retraction with Blood Flow Restriction with Delfi system at 50% PTP on L UE.  BFR utilized for improved muscle strength Explained all risks, benefits, and safety precautions prior to use.  Patient performed protocol of 30 reps followed by 3 sets of 15 for each exercise while being closely monitored for excess discomfort and for proper form.  Discussed nutritional needs after BFR training to maximize benefit.    Manual Therapy - Justified to address any of the following:    Mobilization of joints and soft tissues, manipulation, manual lymphatic drainage, and/or manual traction.    STM biceps, tricep, wrist flex, wrist ext, pronator teres, supinator  Effleurage to the elbow  TPR biceps, wrist  flexors, pronator teres     ---      Flowsheet Row ---   Total Time    Timed Minutes 43 minutes   Total Time 43 minutes          Assessment   Overall mobility continues to improve. Pt removed steristrips and incision site looked very good. Continued use of BFR to try and reduce atrophy through the UE.   Plan   Continue per POC, progress as tolerated    Goals      Goal 1: Improve FOTO score from 46 to 74 or better to demonstrate change for significant functional improvement.      Sessions: 16      Goal 2: Patient will demonstrate independence in prescribed HEP with proper form, sets and reps for safe discharge to an independent program.   Sessions: 16      Goal 3: Patient will demonstrate elbow flexion AROM of greater than or equal to 125 degrees to allow patient to don and doff shirts without pain or compensation.    Sessions: 16      Goal 4: Patient will demonstrate elbow flexion strength to 90% of uninvolved limb to allow patient to return to PLOF with weightlifting.    Sessions: 16  Goal 5: Patient will demonstrate elbow extension strength to 90% of uninvolved side to allow patient to return to full work duties without pain or compensation.   Sessions: 16      Goal 6: Patient will demonstrate elbow extension AROM of greater than or equal to 0 degrees to allow patient to push up from a chair without pain.      Sessions: 32                        Thora Lance, DPT

## 2021-07-27 ENCOUNTER — Inpatient Hospital Stay
Payer: BLUE CROSS/BLUE SHIELD | Attending: Physician Assistant | Admitting: Rehabilitative and Restorative Service Providers"

## 2021-07-27 DIAGNOSIS — M25522 Pain in left elbow: Secondary | ICD-10-CM | POA: Insufficient documentation

## 2021-07-27 NOTE — PT/OT Therapy Note (Signed)
Name: Sam Overbeck III Age: 41 y.o.   Date of Service: 07/27/2021  Referring Physician: Harvest Forest, PA   Date of Injury: 06/23/2021  Date Care Plan Established/Reviewed: 07/02/2021  Date Treatment Started: 07/02/2021  Date Care Plan Established/Reviewed No data was found  Date Treatment Started No data was found  (Historic) Date Care Plan Established/Reviewed No data was found  (Historic) Date Treatment Started No data was found   End of Certification Date: 09/29/2021  Sessions in Plan of Care: 16  Surgery Date: 06/28/2021  MD Follow-up: No data was found  Medbridge Code: No data was found    Visit Count: 8   Diagnosis:   1. Left elbow pain        Subjective     Daily Subjective   Pt states that he has been doing well. No increase in pain ore soreness. Has been complaint with brace and HEP.     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    Objective                   Treatment     Therapeutic Exercises - Justified to address any of the following:  To develop strength, endurance, ROM and/or flexibility.   Updated HEP    Neuromuscular Re-Education - Justified to address any of the following:   Re-education of movement, balance, coordination, kinesthetic sense, posture and/or proprioception for sitting and/or standing activities.   Scapular PNF AE/PD AD/PE rhythmic initiation, prolonged holds, COI    Manual Therapy - Justified to address any of the following:    Mobilization of joints and soft tissues, manipulation, manual lymphatic drainage, and/or manual traction.    STM biceps, tricep, wrist flex, wrist ext, pronator teres, supinator  Effleurage to the elbow  TPR biceps, wrist flexors, pronator teres    Home Exercises   Access Code: [email or web address]  URL: [email or web address]  Date: 07/27/2021  Prepared by: Ned Card    Exercises  Bicep Stretch at Table - 2 x daily - 7 x weekly - 1 sets - 3 reps - 30sec hold  Seated Scapular Retraction - 2 x daily - 7 x weekly - 1 sets - 10 reps - 10sec hold  Prone Scapular Retraction - 2 x daily - 7 x weekly - 1 sets - 10 reps - 10sec hold  Towel Roll Squeeze - 2 x daily - 7 x weekly - 1 sets - 10 reps - 10sec hold       ---      Flowsheet Row ---   Total Time    Timed Minutes 41 minutes   Total Time 41 minutes             Goals      Goal 1: Improve FOTO score from 46 to 74 or better to demonstrate change for significant functional improvement.      Sessions: 16      Goal 2: Patient will demonstrate independence in prescribed HEP with proper form, sets and reps for safe discharge to an independent program.   Sessions: 16      Goal 3: Patient will demonstrate elbow flexion AROM of greater than or equal to 125 degrees to allow patient to don and doff shirts without pain or compensation.  Sessions: 16      Goal 4: Patient will demonstrate elbow flexion strength to 90% of uninvolved limb to allow patient to return to PLOF with weightlifting.    Sessions: 16          Goal 5: Patient will demonstrate elbow extension strength to 90% of uninvolved side to allow patient to return to full work duties without pain or compensation.   Sessions: 16      Goal 6: Patient will demonstrate elbow extension AROM of greater than or equal to 0 degrees to allow patient to push up from a chair without pain.      Sessions: 68                        Thora Lance, DPT

## 2021-07-31 ENCOUNTER — Inpatient Hospital Stay
Payer: BLUE CROSS/BLUE SHIELD | Attending: Physician Assistant | Admitting: Rehabilitative and Restorative Service Providers"

## 2021-07-31 DIAGNOSIS — M25522 Pain in left elbow: Secondary | ICD-10-CM | POA: Insufficient documentation

## 2021-07-31 NOTE — Progress Notes (Signed)
Name:Benjamin Keith Age: 41 y.o.   Date of Service: 07/31/2021  Referring Physician: Harvest ForestMcDonough, Sean, PA   Date of Injury: 06/23/2021  Date Care Plan Established/Reviewed: 07/02/2021  Date Treatment Started: 07/02/2021  Date Care Plan Established/Reviewed No data was found  Date Treatment Started No data was found  (Historic) Date Care Plan Established/Reviewed No data was found  (Historic) Date Treatment Started No data was found   End of Certification Date: 09/29/2021  Sessions in Plan of Care: 16  Surgery Date: 06/28/2021  MD Follow-up: No data was found  Medbridge Code: No data was found    Visit Count: 9   Diagnosis:   1. Left elbow pain        Subjective     Daily Subjective   Pt states that he was able to scratch his eye with his UE since last visit. States that his elbow has been feeling really good. Has appt to see MD next week. States that he thinks the MD said it was ok to take of brace this week.    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    No past medical history on file.    Objective            Upper Body Strength (/5) 07/02/2021 07/31/2021   Additional elbow strength details Not assessed secondary to being post-op evaluation    Elbow AROM (degrees) 07/02/2021    Left flexion 125    Left extension 0    Left forearm supination 90    Left forearm pronation 90    Right flexion 68    Elbow PROM 07/02/2021 07/31/2021   Left flexion  129   Left extension  0   Right flexion 73    Right extension -12    Right forearm supination 90    Right forearm pronation 90    Additional passive range of motion details Forearm supination and pronation measured in full elbow extension                   ---      Flowsheet Row ---   Total Time    Timed Minutes 44 minutes   Total Time 44 minutes          Assessment   Pt is a 41yo male s/p L distal tricep  reconstruction. Pt has been seen for 9 visits. Pt has excellent ROM of the UE. Pt has no complaints currently with the UE. Has been restricting activity with the UE. Have been using BFR to try and improve muscle retention and healing in the UE. Pt does have some weakness in the scapular stabilizers and has some significant atrophy in the L UE compared to the R. Advised pt to continue use of brace till f/u with MD. Pt would continue to benefit from skilled PT for continued work on progression as per protocol.   Plan   Continue per POC    Goals      Goal 1: Improve FOTO score from 46 to 74 or better to demonstrate change for significant functional improvement.     07/31/21 progressing DL   Sessions: 16      Goal 2: Patient will demonstrate independence in prescribed HEP with proper form, sets and reps for  safe discharge to an independent program.    07/31/21 pt states compliance DL   Sessions: 16      Goal 3: Patient will demonstrate elbow flexion AROM of greater than or equal to 125 degrees to allow patient to don and doff shirts without pain or compensation.     07/31/21 pt improving ROM DL   Sessions: 16      Goal 4: Patient will demonstrate elbow flexion strength to 90% of uninvolved limb to allow patient to return to PLOF with weightlifting.     07/31/21 not tested DL   Sessions: 16          Goal 5: Patient will demonstrate elbow extension strength to 90% of uninvolved side to allow patient to return to full work duties without pain or compensation.    07/31/21 strength not tested DL   Sessions: 16      Goal 6: Patient will demonstrate elbow extension AROM of greater than or equal to 0 degrees to allow patient to push up from a chair without pain.     07/31/21 passive motion met 0 but not actively DL   Sessions: 16                        Thora Lance, DPT

## 2021-07-31 NOTE — PT/OT Therapy Note (Signed)
Name: Benjamin Keith Age: 41 y.o.   Date of Service: 07/31/2021  Referring Physician: Harvest Forest, PA   Date of Injury: 06/23/2021  Date Care Plan Established/Reviewed: 07/02/2021  Date Treatment Started: 07/02/2021  Date Care Plan Established/Reviewed No data was found  Date Treatment Started No data was found  (Historic) Date Care Plan Established/Reviewed No data was found  (Historic) Date Treatment Started No data was found   End of Certification Date: 09/29/2021  Sessions in Plan of Care: 16  Surgery Date: 06/28/2021  MD Follow-up: No data was found  Medbridge Code: No data was found    Visit Count: 9   Diagnosis:   1. Left elbow pain        Subjective     Daily Subjective   Pt states that he was able to scratch his eye with his UE since last visit. States that his elbow has been feeling really good. Has appt to see MD next week. States that he thinks the MD said it was ok to take of brace this week.    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    Objective     Passive Range of Motion (degrees)      Left Elbow   Flexion: 129 degrees   Extension: 0 degrees                   Treatment     Therapeutic Exercises - Justified to address any of the following:  To develop strength, endurance, ROM and/or flexibility.   Performed shoulder flexion and ABD with Blood Flow Restriction with Delfi system at 80% PTP on L UE.  BFR utilized for improved strength Explained all risks, benefits, and safety precautions prior to use.  Patient performed protocol of 30 reps followed by 3 sets of 15 for each exercise while being closely monitored for excess discomfort and for proper form.  Discussed nutritional needs after BFR training to maximize benefit.            Manual Therapy - Justified to address any of the following:    Mobilization of joints and  soft tissues, manipulation, manual lymphatic drainage, and/or manual traction.    STM biceps, tricep, wrist flex, wrist ext, pronator teres, supinator  Effleurage to the elbow  TPR biceps, wrist flexors, pronator teres    Home Exercises   Access Code: [email or web address]  URL: [email or web address]  Date: 07/27/2021  Prepared by: Ned Card    Exercises  Bicep Stretch at Table - 2 x daily - 7 x weekly - 1 sets - 3 reps - 30sec hold  Seated Scapular Retraction - 2 x daily - 7 x weekly - 1 sets - 10 reps - 10sec hold  Prone Scapular Retraction - 2 x daily - 7 x weekly - 1 sets - 10 reps - 10sec hold  Towel Roll Squeeze - 2 x daily - 7 x weekly - 1 sets - 10 reps - 10sec hold       ---      Flowsheet Row ---   Total Time    Timed Minutes 44 minutes   Total Time 44 minutes          Assessment   Pt is a 40yo male s/p L distal tricep  reconstruction. Pt has been seen for 9 visits. Pt has excellent ROM of the UE. Pt has no complaints currently with the UE. Has been restricting activity with the UE. Have been using BFR to try and improve muscle retention and healing in the UE. Pt does have some weakness in the scapular stabilizers and has some significant atrophy in the L UE compared to the R. Advised pt to continue use of brace till f/u with MD. Pt would continue to benefit from skilled PT for continued work on progression as per protocol.   Plan   Continue per POC    Goals      Goal 1: Improve FOTO score from 46 to 74 or better to demonstrate change for significant functional improvement.     07/31/21 progressing DL   Sessions: 16      Goal 2: Patient will demonstrate independence in prescribed HEP with proper form, sets and reps for safe discharge to an independent program.    07/31/21 pt states compliance DL   Sessions: 16      Goal 3: Patient will demonstrate elbow flexion AROM of greater than or equal to 125 degrees to allow patient to don and doff shirts without pain or compensation.     07/31/21 pt improving ROM DL    Sessions: 16      Goal 4: Patient will demonstrate elbow flexion strength to 90% of uninvolved limb to allow patient to return to PLOF with weightlifting.     07/31/21 not tested DL   Sessions: 16          Goal 5: Patient will demonstrate elbow extension strength to 90% of uninvolved side to allow patient to return to full work duties without pain or compensation.    07/31/21 strength not tested DL   Sessions: 16      Goal 6: Patient will demonstrate elbow extension AROM of greater than or equal to 0 degrees to allow patient to push up from a chair without pain.     07/31/21 passive motion met 0 but not actively DL   Sessions: 16                        Thora Lance, DPT

## 2021-08-03 ENCOUNTER — Inpatient Hospital Stay: Payer: BLUE CROSS/BLUE SHIELD | Admitting: Physical Therapist

## 2021-08-06 ENCOUNTER — Encounter (INDEPENDENT_AMBULATORY_CARE_PROVIDER_SITE_OTHER): Payer: Self-pay | Admitting: Orthopaedic Surgery

## 2021-08-06 ENCOUNTER — Ambulatory Visit (INDEPENDENT_AMBULATORY_CARE_PROVIDER_SITE_OTHER): Payer: BLUE CROSS/BLUE SHIELD | Admitting: Orthopaedic Surgery

## 2021-08-06 VITALS — Ht 71.0 in | Wt 209.0 lb

## 2021-08-06 DIAGNOSIS — Z4789 Encounter for other orthopedic aftercare: Secondary | ICD-10-CM

## 2021-08-06 DIAGNOSIS — S46312D Strain of muscle, fascia and tendon of triceps, left arm, subsequent encounter: Secondary | ICD-10-CM

## 2021-08-06 NOTE — Progress Notes (Unsigned)
[facility]       Provider: Hermelinda Dellen, MD       Date of Exam:  08/06/2021   Patient:  Benjamin Keith Keith  DOB:  [birth year]    AGE:  41 y.o.  MR#:  [id number]     Diagnosis: s/p left open distal triceps repair and augmentation. DOS: 06/28/2021    HPI: Benjamin Keith returns, 6 weeks status post left triceps surgery as described above. He has been compliant with all post-operative instructions to date. He has started and been attending formal physical therapy as previously prescribed.  He notes that his pain has been well controlled in the post operative period.  He has continued to wear his ROM elbow brace as instructed.  Benjamin Keith is here today for his first regularly scheduled post-operative visit.    For other past medical history, social history, family history and past surgical history please see them listed below.    Club/School/Work Affiliation:     Problem List:   Patient Active Problem List   Diagnosis    Left elbow pain       Past Medical History:  History reviewed. No pertinent past medical history.    Social History:   Social History     Tobacco Use    Smoking status: Never     Passive exposure: Never    Smokeless tobacco: Never   Vaping Use    Vaping Use: Never used   Substance Use Topics    Alcohol use: Never    Drug use: Never       Family History: History reviewed. No pertinent family history.    Past Surgical History:    Past Surgical History:   Procedure Laterality Date    APPENDECTOMY (OPEN)      HERNIA REPAIR      REPAIR, TRICEP TENDON Left 06/28/2021       Medications:  Scheduled Meds:  Continuous Infusions:  PRN Meds:.     Allergies:  No Known Allergies    ROS:  Constitutional: No fatigue, fever, weight loss, or weight gain.   Ears, Nose, Mouth & Throat: No sore throat or hearing loss.   Cardiovascular: No chest pain, blood clots, or leg cramps.   Respiratory: No shortness of breath, cough, or difficulty breathing.   Gastrointestinal: No nausea, vomiting, diarrhea, or loss of  appetite.   Genitourinary: No polyuria or kidney disease.   Musculoskeletal: No joint aches, muscle weakness or swelling of joints/body parts other than that mentioned above.  Integumentary: No finger nail changes or skin dryness.   Neurological: No numbness, burning discomfort, or headaches.   Psychiatric: No depression or anxiety.   Endocrine: No increased thirst, change in appetite or thyroid disease.   Hematologic/Lymphatic: No easy bruising or anemia.     Exam:   Left triceps Exam:  Incisions are well-healed, clean, dry and intact.  No effusion.  No swelling.  No tenderness to palpation.   ROM: 3-130  Strength: deferred  No other areas of tenderness.  DTR and Pathological Reflexes Intact  No lymphadenopathy  Sensation Intact to light touch all distributions of arm and hand  Radial/Median/Ulnar Nerves intact to motor and sensory provocation  Hand Perfused with CR <2 sec  Radial pulse 2+    Vitals: Ht 1.803 m (5\' 11" )   Wt 94.8 kg (209 lb)   BMI 29.15 kg/m     General: Benjamin Keith was awake, alert, oriented, easily engaged, displayed logical thinking with clear speech and was  neat in appearance. His general appearance was normal, well-developed and well-nourished.    Gait: The patient demonstrated a non-antalgic gait with intact coordination and balance.    Studies: No studies obtained on today's visit.    Assessment/Plan: Benjamin Keith Keith is doing well.  His skin incisions are healing well.  He will continue formal physical therapy and begin to discontinue his elbow T scope. Patient was instructed on the dos and don'ts regarding protection of the surgical procedure, as well as appropriate activity levels going forward.  The patient will notify my clinic of any changes or worsening of their symptoms during the interim. All the patient's questions were answered appropriately and completely. We will plan on follow up in 4 weeks. Benjamin Keith Keith understands the plan.     The review of the patient's medications does not in any way  constitute an endorsement, by this clinician,  of their use, dosage, indications, route, efficacy, interactions, or other clinical parameters.    This note may have been generated in part or in whole within the [employer] EMR using Dragon medical speech recognition software and may contain inherent errors or omissions not intended by the user. Grammatical and punctuation errors, random word insertions, deletions, pronoun errors and incomplete sentences are occasional consequences of this technology due to software limitations. Not all errors are caught or corrected.  Although every attempt is made to root out erroneus and incomplete transcription, the note may still not fully represent the intent or opinion of the author. If there are questions or concerns about the content of this note or information contained within the body of this dictation they should be addressed directly with the author for clarification.     I, Hermelinda DellenBrandon J Bryant, MD, have personally performed the services documented. I have performed the history, physical exam and medical decision making; and confirmed the accuracy of the information in the note as entered by Harvest ForestSean McDonough, PA, acting as my scribe.    Date: 08/06/2021 Time: 9:47 AM

## 2021-08-07 ENCOUNTER — Inpatient Hospital Stay
Payer: BLUE CROSS/BLUE SHIELD | Attending: Physician Assistant | Admitting: Rehabilitative and Restorative Service Providers"

## 2021-08-07 DIAGNOSIS — M25522 Pain in left elbow: Secondary | ICD-10-CM | POA: Insufficient documentation

## 2021-08-07 NOTE — PT/OT Therapy Note (Signed)
Name: Ab Leaming III Age: 41 y.o.   Date of Service: 08/07/2021  Referring Physician: Harvest Forest, PA   Date of Injury: 06/23/2021  Date Care Plan Established/Reviewed: 07/02/2021  Date Treatment Started: 07/02/2021  Date Care Plan Established/Reviewed No data was found  Date Treatment Started No data was found  (Historic) Date Care Plan Established/Reviewed No data was found  (Historic) Date Treatment Started No data was found   End of Certification Date: 09/29/2021  Sessions in Plan of Care: 16  Surgery Date: 06/28/2021  MD Follow-up: No data was found  Medbridge Code: No data was found    Visit Count: 10   Diagnosis:   1. Left elbow pain        Subjective     Daily Subjective   Pt states he saw the MD. Was told he is progressing well. Asked if he has started any resistance work yet. States that he was told he can light jog if he wants to. States that he has not had any increase in pain in the elbow. Has been doing better wtihout increased swelling in the brace.     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    Objective                   Treatment     Therapeutic Exercises - Justified to address any of the following:  To develop strength, endurance, ROM and/or flexibility.   Performed wand chest press and standing elbow flexion with Blood Flow Restriction with Delfi system at 80% PTP on L UE.  BFR utilized for improved strength Explained all risks, benefits, and safety precautions prior to use.  Patient performed protocol of 30 reps followed by 3 sets of 15 for each exercise while being closely monitored for excess discomfort and for proper form.  Discussed nutritional needs after BFR training to maximize benefit.    Closed chain elbow end range extension holds 10x10sec  Supine end range tricep extension holds 10x10sec    Manual  Therapy - Justified to address any of the following:    Mobilization of joints and soft tissues, manipulation, manual lymphatic drainage, and/or manual traction.    STM biceps, tricep, wrist flex, wrist ext, pronator teres, supinator  TPR biceps, wrist flexors, pronator teres    Home Exercises   Access Code: [email or web address]  URL: [email or web address]  Date: 07/27/2021  Prepared by: Ned Card    Exercises  Bicep Stretch at Table - 2 x daily - 7 x weekly - 1 sets - 3 reps - 30sec hold  Seated Scapular Retraction - 2 x daily - 7 x weekly - 1 sets - 10 reps - 10sec hold  Prone Scapular Retraction - 2 x daily - 7 x weekly - 1 sets - 10 reps - 10sec hold  Towel Roll Squeeze - 2 x daily - 7 x weekly - 1 sets - 10 reps - 10sec hold       ---      Flowsheet Row ---   Total Time    Timed Minutes 44 minutes   Total Time 44 minutes          Assessment   Continues to have some limitation in end range elbow hyperextension. Use of BFR this visit with  active elbow ROM. Pt tolerated well wtihout issue. Wokred on some closed chain and open chain active end range extension of the elbow.   Plan   Continue per POC, progress elbow end range as able    Goals      Goal 1: Improve FOTO score from 46 to 74 or better to demonstrate change for significant functional improvement.     07/31/21 progressing DL   Sessions: 16      Goal 2: Patient will demonstrate independence in prescribed HEP with proper form, sets and reps for safe discharge to an independent program.    07/31/21 pt states compliance DL   Sessions: 16      Goal 3: Patient will demonstrate elbow flexion AROM of greater than or equal to 125 degrees to allow patient to don and doff shirts without pain or compensation.     07/31/21 pt improving ROM DL   Sessions: 16      Goal 4: Patient will demonstrate elbow flexion strength to 90% of uninvolved limb to allow patient to return to PLOF with weightlifting.     07/31/21 not tested DL   Sessions: 16          Goal 5: Patient will  demonstrate elbow extension strength to 90% of uninvolved side to allow patient to return to full work duties without pain or compensation.    07/31/21 strength not tested DL   Sessions: 16      Goal 6: Patient will demonstrate elbow extension AROM of greater than or equal to 0 degrees to allow patient to push up from a chair without pain.     07/31/21 passive motion met 0 but not actively DL   Sessions: 16                        Thora Lanceaniel H Isay Perleberg, DPT

## 2021-08-10 ENCOUNTER — Inpatient Hospital Stay
Payer: BLUE CROSS/BLUE SHIELD | Attending: Physician Assistant | Admitting: Rehabilitative and Restorative Service Providers"

## 2021-08-10 DIAGNOSIS — M25522 Pain in left elbow: Secondary | ICD-10-CM | POA: Insufficient documentation

## 2021-08-10 NOTE — PT/OT Therapy Note (Signed)
Name: Benjamin Keith Age: 41 y.o.   Date of Service: 08/10/2021  Referring Physician: Harvest Forest, PA   Date of Injury: 06/23/2021  Date Care Plan Established/Reviewed: 07/02/2021  Date Treatment Started: 07/02/2021  Date Care Plan Established/Reviewed No data was found  Date Treatment Started No data was found  (Historic) Date Care Plan Established/Reviewed No data was found  (Historic) Date Treatment Started No data was found   End of Certification Date: 09/29/2021  Sessions in Plan of Care: 16  Surgery Date: 06/28/2021  MD Follow-up: No data was found  Medbridge Code: No data was found    Visit Count: 11   Diagnosis:   1. Left elbow pain        Subjective     Daily Subjective   Pt states that he has been doing well since last visit. Still limiting lifting. Has been more comfortable out of the brace.     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    Objective                     Treatment     Therapeutic Exercises - Justified to address any of the following:  To develop strength, endurance, ROM and/or flexibility.   Manual stretch into elbow extension   Supine skul crusher 15x   Prone I and T with 3# 2x10   Bicep stretch at wall 3x30sec  Open book 20x ea  Updated HEP    Manual Therapy - Justified to address any of the following:    Mobilization of joints and soft tissues, manipulation, manual lymphatic drainage, and/or manual traction.    STM biceps, tricep, wrist flex, wrist ext, pronator teres, supinator  TPR biceps, wrist flexors, pronator teres    Home Exercises   Access Code: [email or web address]  URL: [email or web address]  Date: 08/10/2021  Prepared by: Ned Card    Exercises  Bicep Stretch at Table - 2 x daily - 7 x weekly - 1 sets - 3 reps - 30sec hold  Seated Scapular Retraction - 2 x daily - 7 x weekly - 1 sets - 10  reps - 10sec hold  Prone Scapular Retraction - 2 x daily - 7 x weekly - 1 sets - 10 reps - 10sec hold  Towel Roll Squeeze - 2 x daily - 7 x weekly - 1 sets - 10 reps - 10sec hold  Supine Elbow Extension Stretch in Supination - 2 x daily - 7 x weekly - 1 sets - 10 reps - 10sec hold  Standing Bicep Stretch at Wall - 2 x daily - 7 x weekly - 1 sets - 3 reps - 30sec hold         ---      Flowsheet Row ---   Total Time    Timed Minutes 44 minutes   Total Time 44 minutes          Assessment   Continued to work on end range extension this visit. Noted some tension in the bicep region. Added bicep stretch for home. Advised to do some AROM of the elbow next week and isometric tricep contraction next week due to pt not being on schedule for 2 weeks due to travel and scheduling.   Plan  Continue per POC, f/u on ROM, work on progression as per protocol       Goals      Goal 1: Improve FOTO score from 46 to 74 or better to demonstrate change for significant functional improvement.     07/31/21 progressing DL   Sessions: 16     Goal 2: Patient will demonstrate independence in prescribed HEP with proper form, sets and reps for safe discharge to an independent program.    07/31/21 pt states compliance DL   Sessions: 16     Goal 3: Patient will demonstrate elbow flexion AROM of greater than or equal to 125 degrees to allow patient to don and doff shirts without pain or compensation.     07/31/21 pt improving ROM DL   Sessions: 16     Goal 4: Patient will demonstrate elbow flexion strength to 90% of uninvolved limb to allow patient to return to PLOF with weightlifting.     07/31/21 not tested DL   Sessions: 16     Goal 5: Patient will demonstrate elbow extension strength to 90% of uninvolved side to allow patient to return to full work duties without pain or compensation.    07/31/21 strength not tested DL   Sessions: 16     Goal 6: Patient will demonstrate elbow extension AROM of greater than or equal to 0 degrees to allow patient to push up  from a chair without pain.     07/31/21 passive motion met 0 but not actively DL   Sessions: 16                                                Thora Lance, DPT

## 2021-08-14 ENCOUNTER — Inpatient Hospital Stay: Payer: BLUE CROSS/BLUE SHIELD | Admitting: Rehabilitative and Restorative Service Providers"

## 2021-08-17 ENCOUNTER — Inpatient Hospital Stay: Payer: BLUE CROSS/BLUE SHIELD | Admitting: Rehabilitative and Restorative Service Providers"

## 2021-08-20 ENCOUNTER — Ambulatory Visit (INDEPENDENT_AMBULATORY_CARE_PROVIDER_SITE_OTHER): Payer: BLUE CROSS/BLUE SHIELD | Admitting: Orthopaedic Surgery

## 2021-08-21 ENCOUNTER — Inpatient Hospital Stay: Payer: BLUE CROSS/BLUE SHIELD | Admitting: Rehabilitative and Restorative Service Providers"

## 2021-08-24 ENCOUNTER — Inpatient Hospital Stay
Payer: BLUE CROSS/BLUE SHIELD | Attending: Physician Assistant | Admitting: Rehabilitative and Restorative Service Providers"

## 2021-08-24 DIAGNOSIS — M25522 Pain in left elbow: Secondary | ICD-10-CM | POA: Insufficient documentation

## 2021-08-24 NOTE — PT/OT Therapy Note (Signed)
Name: Benjamin Keith Age: 41 y.o.   Date of Service: 08/24/2021  Referring Physician: Harvest Forest, PA   Date of Injury: 06/23/2021  Date Care Plan Established/Reviewed: 07/02/2021  Date Treatment Started: 07/02/2021  Date Care Plan Established/Reviewed No data was found  Date Treatment Started No data was found  (Historic) Date Care Plan Established/Reviewed No data was found  (Historic) Date Treatment Started No data was found   End of Certification Date: 09/29/2021  Sessions in Plan of Care: 16  Surgery Date: 06/28/2021  MD Follow-up: No data was found  Medbridge Code: PFCNADNE    Visit Count: 12   Diagnosis:   1. Left elbow pain        Subjective     Daily Subjective   Pt states that he is feeling very good. States that he gets some occasional discomfort in the elbow at times. But he has been doing very well since the last visit.     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    Objective     Active Range of Motion (degrees)     Right Elbow   Flexion: 142 degrees   Extension: -5 degrees                     Treatment     Therapeutic Exercises - Justified to address any of the following:  To develop strength, endurance, ROM and/or flexibility.   Supine tricep extension 0# 3x10  Wrist extension 5# 2x10  Wrist flexion 5# 2x10  Sup/pronation 4# 2x10  Bicep curl standing 3# 3x10  Keiser row 20# 3x10  Pec fly 15# 2x10  Updated HEP    Neuromuscular Re-Education - Justified to address any of the following:   Re-education of movement, balance, coordination, kinesthetic sense, posture and/or proprioception for sitting and/or standing activities.   Prone on ball scapular ITY focus on scapular motor control to improve stabilization     Manual Therapy - Justified to address any of the following:    Mobilization of joints and soft tissues,  manipulation, manual lymphatic drainage, and/or manual traction.    STM biceps, wrist flexors, wrist extensors, pronator teres, supinator       ---      Flowsheet Row ---   Total Time    Timed Minutes 45 minutes   Total Time 45 minutes          Assessment   Had some limitation in elbow extension thiis visit. Was able to work through soft tissue and get full elbow ROM. Worked on progression in strength and updated HEP this visit. Discussed need to avoiding pushing to fast due to feeling better. Pt understood.   Plan   Continue per POC, f/u on HEP, continue to work on tricep strength      Goals      Goal 1: Improve FOTO score from 46 to 74 or better to demonstrate change for significant functional improvement.     07/31/21 progressing DL   Sessions: 16     Goal 2: Patient will demonstrate independence in prescribed HEP with proper form, sets and reps for safe discharge to an independent program.    07/31/21 pt states compliance DL   Sessions: 16     Goal  3: Patient will demonstrate elbow flexion AROM of greater than or equal to 125 degrees to allow patient to don and doff shirts without pain or compensation.     07/31/21 pt improving ROM DL   Sessions: 16     Goal 4: Patient will demonstrate elbow flexion strength to 90% of uninvolved limb to allow patient to return to PLOF with weightlifting.     07/31/21 not tested DL   Sessions: 16     Goal 5: Patient will demonstrate elbow extension strength to 90% of uninvolved side to allow patient to return to full work duties without pain or compensation.    07/31/21 strength not tested DL   Sessions: 16     Goal 6: Patient will demonstrate elbow extension AROM of greater than or equal to 0 degrees to allow patient to push up from a chair without pain.     07/31/21 passive motion met 0 but not actively DL   Sessions: 16                                                Thora Lance, DPT

## 2021-09-04 ENCOUNTER — Inpatient Hospital Stay
Payer: BLUE CROSS/BLUE SHIELD | Attending: Physician Assistant | Admitting: Rehabilitative and Restorative Service Providers"

## 2021-09-04 DIAGNOSIS — M25522 Pain in left elbow: Secondary | ICD-10-CM | POA: Insufficient documentation

## 2021-09-04 NOTE — PT/OT Therapy Note (Signed)
Name: Benjamin Keith Age: 41 y.o.   Date of Service: 09/04/2021  Referring Physician: Harvest Forest, PA   Date of Injury: 06/23/2021  Date Care Plan Established/Reviewed: 07/02/2021  Date Treatment Started: 07/02/2021  Date Care Plan Established/Reviewed No data was found  Date Treatment Started No data was found  (Historic) Date Care Plan Established/Reviewed No data was found  (Historic) Date Treatment Started No data was found   End of Certification Date: 09/29/2021  Sessions in Plan of Care: 16  Surgery Date: 06/28/2021  MD Follow-up: No data was found  Medbridge Code: PFCNADNE    Visit Count: 13   Diagnosis:   1. Left elbow pain        Subjective     Daily Subjective   Pt states that he has been doing very well. States that he has been doing the HEP at home. Has gotten a cable machine at home. Feels that he will change how he is working out now to try and improve his strength and control. States he gets occasional tightness in the elbow.     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    Objective     Active Range of Motion (degrees)     Left Elbow   Flexion: 142 degrees   Extension: -2 degrees                     Treatment     Therapeutic Exercises - Justified to address any of the following:  To develop strength, endurance, ROM and/or flexibility.   Performed supine tricep extension 3# with Blood Flow Restriction with Delfi system at 80% PTP on L UE.  BFR utilized for improved tricep strength Explained all risks, benefits, and safety precautions prior to use.  Patient performed protocol of 30 reps followed by 3 sets of 15 for each exercise while being closely monitored for excess discomfort and for proper form.  Discussed nutritional needs after BFR training to maximize benefit.      Row on keiser 3x10 35#  Lat pulldown on  kesier 3x10 30#   Tricep kick back 5# with hold at end range 5# 3x10  Chest press 10# 3x10 focus on end range tricep extension  Isometric tricep extension at wall focus on scapular stabilization     Manual Therapy - Justified to address any of the following:    Mobilization of joints and soft tissues, manipulation, manual lymphatic drainage, and/or manual traction.    STM biceps, wrist flexors, wrist extensors, pronator teres, supinator  BC along the olecranon        ---      Flowsheet Row ---   Total Time    Timed Minutes 42 minutes   Total Time 42 minutes          Assessment   Continues to have some limitation into end range extension of the elbow. Passively able to get into end range. Continued to work on combined and isolated strength. Added some isometric elbow extension holds in weight bearing to try and progress terminal elbow extension. Advised pt to do for home.   Plan   Continue to work on tricep activation in end range      Goals      Goal  1: Improve FOTO score from 46 to 74 or better to demonstrate change for significant functional improvement.     07/31/21 progressing DL   Sessions: 16     Goal 2: Patient will demonstrate independence in prescribed HEP with proper form, sets and reps for safe discharge to an independent program.    07/31/21 pt states compliance DL   Sessions: 16     Goal 3: Patient will demonstrate elbow flexion AROM of greater than or equal to 125 degrees to allow patient to don and doff shirts without pain or compensation.     07/31/21 pt improving ROM DL   Sessions: 16     Goal 4: Patient will demonstrate elbow flexion strength to 90% of uninvolved limb to allow patient to return to PLOF with weightlifting.     07/31/21 not tested DL   Sessions: 16     Goal 5: Patient will demonstrate elbow extension strength to 90% of uninvolved side to allow patient to return to full work duties without pain or compensation.    07/31/21 strength not tested DL   Sessions: 16     Goal 6: Patient will  demonstrate elbow extension AROM of greater than or equal to 0 degrees to allow patient to push up from a chair without pain.     07/31/21 passive motion met 0 but not actively DL   Sessions: 16                                                Benjamin Keith, DPT

## 2021-09-07 ENCOUNTER — Inpatient Hospital Stay
Payer: BLUE CROSS/BLUE SHIELD | Attending: Physician Assistant | Admitting: Rehabilitative and Restorative Service Providers"

## 2021-09-07 DIAGNOSIS — M25522 Pain in left elbow: Secondary | ICD-10-CM | POA: Insufficient documentation

## 2021-09-10 ENCOUNTER — Encounter (INDEPENDENT_AMBULATORY_CARE_PROVIDER_SITE_OTHER): Payer: Self-pay | Admitting: Orthopaedic Surgery

## 2021-09-10 ENCOUNTER — Ambulatory Visit (INDEPENDENT_AMBULATORY_CARE_PROVIDER_SITE_OTHER): Payer: BLUE CROSS/BLUE SHIELD | Admitting: Orthopaedic Surgery

## 2021-09-10 VITALS — BP 127/86 | HR 56 | Resp 12

## 2021-09-10 DIAGNOSIS — Z4789 Encounter for other orthopedic aftercare: Secondary | ICD-10-CM

## 2021-09-10 DIAGNOSIS — S46312D Strain of muscle, fascia and tendon of triceps, left arm, subsequent encounter: Secondary | ICD-10-CM

## 2021-09-10 NOTE — PT/OT Therapy Note (Signed)
Name:Benjamin Keith Age: 41 y.o.   Date of Service: 09/07/2021  Referring Physician: Harvest Forest, PA   Date of Injury: 06/23/2021  Date Care Plan Established/Reviewed: 07/02/2021  Date Treatment Started: 07/02/2021  Date Care Plan Established/Reviewed No data was found  Date Treatment Started No data was found  (Historic) Date Care Plan Established/Reviewed No data was found  (Historic) Date Treatment Started No data was found   End of Certification Date: 09/29/2021  Sessions in Plan of Care: 16  Surgery Date: 06/28/2021  MD Follow-up: No data was found  Medbridge Code: PFCNADNE    Visit Count: 14   Diagnosis:   1. Left elbow pain        Subjective     Daily Subjective   Pt states that he has been doing well. Doing gym exercises at home. Has been doing the tricep kickbacks at home also. States that he did not do the weight bearing terminal elbow extensions.     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    No past medical history on file.    Objective                     Treatment     Therapeutic Exercises - Justified to address any of the following:  To develop strength, endurance, ROM and/or flexibility.   Performed supine tricep extension 3# and elbow terminal extension at EOB with Blood Flow Restriction with Delfi system at 80% PTP on L UE.  BFR utilized for improved trcep strength Explained all risks, benefits, and safety precautions prior to use.  Patient performed protocol of 30 reps followed by 3 sets of 15 for each exercise while being closely monitored for excess discomfort and for proper form.  Discussed nutritional needs after BFR training to maximize benefit.    Neuromuscular Re-Education - Justified to address any of the following:   Re-education of movement, balance, coordination, kinesthetic sense, posture and/or  proprioception for sitting and/or standing activities.   Therball walk outs focus on maintain terminal elbow extension through motion  Overhead hold in supine of monster band with lower leg drops focus on elbow extension stabilization and motor control with core stabilization     Manual Therapy - Justified to address any of the following:    Mobilization of joints and soft tissues, manipulation, manual lymphatic drainage, and/or manual traction.    STM biceps, wrist flexors, wrist extensors, pronator teres, supinator  BC along the olecranon        ---      Flowsheet Row ---   Total Time    Timed Minutes 45 minutes   Total Time 45 minutes          Assessment   Continues to lack some end range extension but able to get into it when cued and with manual work over the elbow. Continue use of BFR for hypertrophy of the tricep. Worked on maintaining terminal elbow extension with weight bearing through the UE.   Plan   Continue per POC, progress as able      Goals      Goal 1: Improve FOTO score from 46 to 74 or better to demonstrate change for significant functional improvement.     07/31/21 progressing DL  Sessions: 16     Goal 2: Patient will demonstrate independence in prescribed HEP with proper form, sets and reps for safe discharge to an independent program.    07/31/21 pt states compliance DL   Sessions: 16     Goal 3: Patient will demonstrate elbow flexion AROM of greater than or equal to 125 degrees to allow patient to don and doff shirts without pain or compensation.     07/31/21 pt improving ROM DL   Sessions: 16     Goal 4: Patient will demonstrate elbow flexion strength to 90% of uninvolved limb to allow patient to return to PLOF with weightlifting.     07/31/21 not tested DL   Sessions: 16     Goal 5: Patient will demonstrate elbow extension strength to 90% of uninvolved side to allow patient to return to full work duties without pain or compensation.    07/31/21 strength not tested DL   Sessions: 16     Goal 6:  Patient will demonstrate elbow extension AROM of greater than or equal to 0 degrees to allow patient to push up from a chair without pain.     07/31/21 passive motion met 0 but not actively DL   Sessions: 16                                                Thora Lance, DPT

## 2021-09-10 NOTE — Progress Notes (Signed)
[facility]    CHIEF COMPLAINT:   Ten weeks status post left open distal triceps repair with augmentation.    HISTORY OF PRESENT ILLNESS:   Benjamin Keith is a 41 year old male who presents today 10 weeks status post left open distal triceps repair with augmentation performed on 06/28/2021.     The patient is doing very well and denies any pain.   He is working with physical therapy and has started strengthening. He is currently doing BFR training.   He is asking about potential calcium build-up.   He has not attempted shooting at this point.     For other past medical history, social history, family history and past surgical history please see them listed below.     REVIEW OF SYSTEMS:  Constitutional: No fatigue, fever, weight loss, or weight gain.    Ears, Nose, Mouth & Throat: No sore throat or hearing loss.    Cardiovascular: No chest pain, blood clots, or leg cramps.    Respiratory: No shortness of breath, cough, or difficulty breathing.    Gastrointestinal: No nausea, vomiting, diarrhea, or loss of appetite.    Genitourinary: No polyuria or kidney disease.    Musculoskeletal: No joint aches, muscle weakness or swelling of joints/body parts other than that mentioned above.   Integumentary: No finger nail changes or skin dryness.    Neurological: No numbness, burning discomfort, or headaches.    Psychiatric: No depression or anxiety.    Endocrine: No increased thirst, change in appetite or thyroid disease.    Hematologic/Lymphatic: No easy bruising or anemia.    Left Elbow Exam:   Skin Intact   Erythema None present   Swelling None present   Atrophy No identifiable   TTP at Medial epicondyle: None   TTP at Lateral epicondyle: None   TTP Olecranon: None   Other Areas of TTP None     ROM: 0-145   Strength: WNL   Pain with resisted wrist extension: Negative   Pain with resisted finger extension: Negative   Pain with resisted supination: Negative   Pain with resisted wrist flexion: Negative   Pain with  resisted finger flexion: Negative   Pain with resisted pronation: Negative     Ulnar tinnel sign: Negative   Terminal flexion sign: Negative   Radial Pulse 2+   DTR and Pathological Reflexes Intact   No lymphadenopathy   Radial/Median/Ulnar Nerves intact to motor and sensory provocation   Hand Perfused with CR <2 sec    General: The patient was awake, alert, oriented, easily engaged, displayed logical thinking with clear speech and was neat in appearance. Their general appearance was normal, well-developed and well-nourished.     Assessment/Plan:  The patient is a 41 year old male who is 10 weeks status post left open distal triceps repair with augmentation.    The patient has regained his range of motion. He will continue progressing with physical therapy on strengthening. He will follow up in 8 weeks.     The review of the patient's medications does not in any way constitute an endorsement, by this clinician,  of their use, dosage, indications, route, efficacy, interactions, or other clinical parameters.    Transcribed for Dr. Beverely Pace, by Mechele Claude on 09/11/2021 at 11:43 a.m. Powered by [employer].

## 2021-09-11 ENCOUNTER — Inpatient Hospital Stay
Payer: BLUE CROSS/BLUE SHIELD | Attending: Physician Assistant | Admitting: Rehabilitative and Restorative Service Providers"

## 2021-09-11 DIAGNOSIS — M25522 Pain in left elbow: Secondary | ICD-10-CM

## 2021-09-11 NOTE — PT/OT Therapy Note (Signed)
Name: Benjamin Keith Age: 41 y.o.   Date of Service: 09/11/2021  Referring Physician: Harvest Forest, PA   Date of Injury: 06/23/2021  Date Care Plan Established/Reviewed: 07/02/2021  Date Treatment Started: 07/02/2021  Date Care Plan Established/Reviewed No data was found  Date Treatment Started No data was found  (Historic) Date Care Plan Established/Reviewed No data was found  (Historic) Date Treatment Started No data was found   End of Certification Date: 09/29/2021  Sessions in Plan of Care: 16  Surgery Date: 06/28/2021  MD Follow-up: No data was found  Medbridge Code: PFCNADNE    Visit Count: 15   Diagnosis:   1. Left elbow pain        Subjective     Daily Subjective   Pt states he has been feeling good. Increasing his gym exercises at home. States that he tends to able to avoid pushing through discomfort. States that he is not very limited with active motion.     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    Objective                     Treatment     Therapeutic Exercises - Justified to address any of the following:  To develop strength, endurance, ROM and/or flexibility.   Performed push up at EOB with Blood Flow Restriction with Delfi system at 80% PTP on L UE.  BFR utilized for improved tricep strength Explained all risks, benefits, and safety precautions prior to use.  Patient performed protocol of 30 reps followed by 3 sets of 15 for each exercise while being closely monitored for excess discomfort and for proper form.  Discussed nutritional needs after BFR training to maximize benefit.    Farmer carry 30# 3x cues for maintaining midline stability    Neuromuscular Re-Education - Justified to address any of the following:   Re-education of movement, balance, coordination, kinesthetic sense, posture and/or proprioception for  sitting and/or standing activities.   Single arm punch focus on rotation motor control and power generation from the floor up to improve trunk integration with UE and LE integration    Manual Therapy - Justified to address any of the following:    Mobilization of joints and soft tissues, manipulation, manual lymphatic drainage, and/or manual traction.    STM biceps, wrist flexors, wrist extensors, pronator teres, supinator  BC along the olecranon        ---      Flowsheet Row ---   Total Time    Timed Minutes 45 minutes   Total Time 45 minutes          Assessment   Continues to have some tightness along the soft tissue from the wrist flexors and biceps. Able to get good passive extension of the elbow. Continued to work on terminal elbow extension. Has a tendency to push ribcage anteririly and push into spinal extension when asked to elbow extend in weight bearing. Added functional full body punch to improve motor integration and control.   Plan   Continue per POC, progress UE strength as able      Goals      Goal 1: Improve FOTO score from 46 to 74 or better to demonstrate change for significant functional  improvement.     07/31/21 progressing DL   Sessions: 16     Goal 2: Patient will demonstrate independence in prescribed HEP with proper form, sets and reps for safe discharge to an independent program.    07/31/21 pt states compliance DL   Sessions: 16     Goal 3: Patient will demonstrate elbow flexion AROM of greater than or equal to 125 degrees to allow patient to don and doff shirts without pain or compensation.     07/31/21 pt improving ROM DL   Sessions: 16     Goal 4: Patient will demonstrate elbow flexion strength to 90% of uninvolved limb to allow patient to return to PLOF with weightlifting.     07/31/21 not tested DL   Sessions: 16     Goal 5: Patient will demonstrate elbow extension strength to 90% of uninvolved side to allow patient to return to full work duties without pain or compensation.    07/31/21 strength  not tested DL   Sessions: 16     Goal 6: Patient will demonstrate elbow extension AROM of greater than or equal to 0 degrees to allow patient to push up from a chair without pain.     07/31/21 passive motion met 0 but not actively DL   Sessions: 16                                                Thora Lance, DPT

## 2021-09-14 ENCOUNTER — Inpatient Hospital Stay
Payer: BLUE CROSS/BLUE SHIELD | Attending: Physician Assistant | Admitting: Rehabilitative and Restorative Service Providers"

## 2021-09-14 DIAGNOSIS — M25522 Pain in left elbow: Secondary | ICD-10-CM | POA: Insufficient documentation

## 2021-09-14 NOTE — PT/OT Therapy Note (Signed)
Name: Benjamin Keith Age: 41 y.o.   Date of Service: 09/14/2021  Referring Physician: Harvest Forest, PA   Date of Injury: 06/23/2021  Date Care Plan Established/Reviewed: 09/14/2021  Date Treatment Started: 07/02/2021  Date Care Plan Established/Reviewed No data was found  Date Treatment Started No data was found  (Historic) Date Care Plan Established/Reviewed No data was found  (Historic) Date Treatment Started No data was found   End of Certification Date: 12/12/2021  Sessions in Plan of Care: 16  Surgery Date: 06/28/2021  MD Follow-up: No data was found  Medbridge Code: PFCNADNE    Visit Count: 16   Diagnosis:   1. Left elbow pain        Subjective     Daily Subjective   Pt states that he has been doing very well. Has been doing isolated tricep exercises. States that he is slowly progressing the strength. With bicep strength he feels he is doing very well. States that he has been consistent with no pain.     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    Objective     Strength/Myotome Testing (/5)     Left Shoulder     Planes of Motion   Flexion: 5   Extension: 4   Abduction: 5     Right Shoulder     Planes of Motion   Flexion: 5   Extension: 5   Abduction: 5     Left Elbow   Flexion: 4+  Extension: 4-  Extension: 14.4 PSI    Right Elbow   Flexion: 5  Extension: 5  Extension: 56.1 PSI                    Treatment     Therapeutic Exercises - Justified to address any of the following:  To develop strength, endurance, ROM and/or flexibility.   Performed skull crusher 5# with Blood Flow Restriction with Delfi system at 50% PTP on L UE.  BFR utilized for improved tricep strength Explained all risks, benefits, and safety precautions prior to use.  Patient performed protocol of 30 reps followed by 3 sets of 15 for each exercise while  being closely monitored for excess discomfort and for proper form.  Discussed nutritional needs after BFR training to maximize benefit.    Dumbbell chest press focus on end range extension 15# 3x10  Scap depression in pull up position but unable to perform    Neuromuscular Re-Education - Justified to address any of the following:   Re-education of movement, balance, coordination, kinesthetic sense, posture and/or proprioception for sitting and/or standing activities.   Scapular PNF PD prolonged holds with arm in neutral and then in overhead y position    Manual Therapy - Justified to address any of the following:    Mobilization of joints and soft tissues, manipulation, manual lymphatic drainage, and/or manual traction.    STM biceps, wrist flexors, wrist extensors, pronator teres, supinator  BC along the olecranon        ---      Flowsheet Row ---   Total Time    Timed Minutes 45 minutes   Total Time 45 minutes          Assessment   Pt is a 40yo  male s/p L tricep reconstruction. Pt has been seen for 16 visits. Pt has excellent ROM. Strength is progressing but significant difference between the R and L UE. Pt is able to do most basic activities without increased pain. Pt however lifting pushing and carrying have been limited due to weakness. Pt would continue to benefit from further PT for continue work on progression in UE strength for return to PLOF.   Plan   Continue per POC, work on scapular motor control       Goals      Goal 1: Improve FOTO score from 46 to 74 or better to demonstrate change for significant functional improvement.     09/14/21 progressing DL   Sessions: 32     Goal 2: Patient will demonstrate independence in prescribed HEP with proper form, sets and reps for safe discharge to an independent program.    09/14/21 pt states compliance DL   Sessions: 32     Goal 3: Patient will demonstrate elbow flexion AROM of greater than or equal to 125 degrees to allow patient to don and doff shirts without  pain or compensation.     09/14/21 met DL   Sessions: 32     Goal 4: Patient will demonstrate elbow flexion strength to 90% of uninvolved limb to allow patient to return to PLOF with weightlifting.     09/14/21 progressing DL   Sessions: 32     Goal 5: Patient will demonstrate elbow extension strength to 90% of uninvolved side to allow patient to return to full work duties without pain or compensation.    09/14/21 progressing DL   Sessions: 32     Goal 6: Patient will demonstrate elbow extension AROM of greater than or equal to 0 degrees to allow patient to push up from a chair without pain.     09/14/21 progressingDL   Sessions: 32                                                Thora Lanceaniel H Brock Larmon, DPT

## 2021-09-14 NOTE — Progress Notes (Signed)
Name:Benjamin Keith Age: 41 y.o.   Date of Service: 09/14/2021  Referring Physician: Harvest Forest, PA   Date of Injury: 06/23/2021  Date Care Plan Established/Reviewed: 09/14/2021  Date Treatment Started: 07/02/2021  Date Care Plan Established/Reviewed No data was found  Date Treatment Started No data was found  (Historic) Date Care Plan Established/Reviewed No data was found  (Historic) Date Treatment Started No data was found   End of Certification Date: 12/12/2021  Sessions in Plan of Care: 16  Surgery Date: 06/28/2021  MD Follow-up: No data was found  Medbridge Code: PFCNADNE    Visit Count: 16   Diagnosis:   1. Left elbow pain        Subjective     Daily Subjective   Pt states that he has been doing very well. Has been doing isolated tricep exercises. States that he is slowly progressing the strength. With bicep strength he feels he is doing very well. States that he has been consistent with no pain.     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    No past medical history on file.    Objective               07/02/2021 07/31/2021 08/24/2021 09/04/2021 09/14/2021   Cervical/Thoracic Strength (/5)        Left shoulder flexion     5    Left shoulder extension     4    Left shoulder abduction (C5)     5    Left elbow flexion (C6)     4+    Left elbow extension (C7)     4-    Right shoulder flexion     5    Right shoulder extension     5    Right shoulder abduction     5    Right elbow flexion (C6)     5    Right elbow extension (C7)     5    Elbow AROM (degrees)        Left flexion 125    142     Left extension 0    -2     Left forearm supination 90        Left forearm pronation 90        Right flexion 68   142      Right extension   -5      Elbow PROM        Left flexion  129       Left extension  0       Right flexion 73        Right  extension -12        Right forearm supination 90        Right forearm pronation 90        Additional passive range of motion details Forearm supination and pronation measured in full elbow extension        Elbow Strength (/5)        Left elbow flexion     4+    Left elbow extension     4-    Left elbow extension PSI     14.4    Right elbow flexion  5    Right elbow extension     5    Right elbow extension PSI     56.1    Additional strength details Not assessed secondary to being post-op evaluation        Shoulder Strength (/5)        Left shoulder flexion     5    Left shoulder extension     4    Left shoulder abduction     5    Right shoulder flexion     5    Right shoulder extension     5    Right shoulder abduction     5    Wrist Strength (/5)        Left elbow flexion     4+    Left elbow extension     4-    Left elbow extension PSI     14.4    Right elbow flexion     5    Right elbow extension     5    Right elbow extension PSI     56.1    Upper Body Strength (/5)        Left shoulder flexion     5    Left shoulder extension     4    Left shoulder abduction     5    Left elbow flexion (C6)      4+    Left elbow extension     4-    Left elbow extension PSI     14.4    Additional elbow strength details Not assessed secondary to being post-op evaluation        Right shoulder flexion      5    Right shoulder extension      5    Right shoulder abduction     5                     ---      Flowsheet Row ---   Total Time    Timed Minutes 45 minutes   Total Time 45 minutes          Assessment   Pt is a 42yo male s/p L tricep reconstruction. Pt has been seen for 16 visits. Pt has excellent ROM. Strength is progressing but significant difference between the R and L UE. Pt is able to do most basic activities without increased pain. Pt however lifting pushing and carrying have been limited due to weakness. Pt would continue to benefit from further PT for continue work on progression in UE strength for return to PLOF.    Prognosis: excellent  Patient is aware of diagnosis, prognosis and consents to plan of care: Yes  Plan   Visits per week: 2  Number of Sessions: 16  Direct One on One  84132: Therapeutic Exercise: To Develop Strength and Endurance, ROM and Flexibility  [id number]: Neuromuscular Reeducation  97140: Manual Therapy techniques (mobilization, manipulation, manual traction)  97530: Therapeutic Activities: Dynamic activities to improve functional performance  Dry Needling  Supervised Modalities  97010: Thermal modalities: hot/cold packs  44010: Electrical stimulation  97016: Vasopneumatic devices  Continue per POC      Goals      Goal 1: Improve FOTO score from 46 to 74 or better to demonstrate change for significant functional improvement.     09/14/21 progressing DL   Sessions: 32  Goal 2: Patient will demonstrate independence in prescribed HEP with proper form, sets and reps for safe discharge to an independent program.    09/14/21 pt states compliance DL   Sessions: 32     Goal 3: Patient will demonstrate elbow flexion AROM of greater than or equal to 125 degrees to allow patient to don and doff shirts without pain or compensation.     09/14/21 met DL   Sessions: 32     Goal 4: Patient will demonstrate elbow flexion strength to 90% of uninvolved limb to allow patient to return to PLOF with weightlifting.     09/14/21 progressing DL   Sessions: 32     Goal 5: Patient will demonstrate elbow extension strength to 90% of uninvolved side to allow patient to return to full work duties without pain or compensation.    09/14/21 progressing DL   Sessions: 32     Goal 6: Patient will demonstrate elbow extension AROM of greater than or equal to 0 degrees to allow patient to push up from a chair without pain.     09/14/21 progressingDL   Sessions: 32                                                Thora Lance, DPT

## 2021-09-18 ENCOUNTER — Inpatient Hospital Stay
Payer: BLUE CROSS/BLUE SHIELD | Attending: Physician Assistant | Admitting: Rehabilitative and Restorative Service Providers"

## 2021-09-18 DIAGNOSIS — M25522 Pain in left elbow: Secondary | ICD-10-CM | POA: Insufficient documentation

## 2021-09-18 NOTE — PT/OT Therapy Note (Signed)
Name: Yuto Cajuste III Age: 41 y.o.   Date of Service: 09/18/2021  Referring Physician: Harvest Forest, PA   Date of Injury: 06/23/2021  Date Care Plan Established/Reviewed: 09/14/2021  Date Treatment Started: 07/02/2021  Date Care Plan Established/Reviewed No data was found  Date Treatment Started No data was found  (Historic) Date Care Plan Established/Reviewed No data was found  (Historic) Date Treatment Started No data was found   End of Certification Date: 12/12/2021  Sessions in Plan of Care: 16  Surgery Date: 06/28/2021  MD Follow-up: No data was found  Medbridge Code: PFCNADNE    Visit Count: 17   Diagnosis:   1. Left elbow pain        Subjective     Daily Subjective   Pt states that he has been doing more isolated strength at home. States that he has noticed a differentce. States that he still uses compound movements also.     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    Objective                     Treatment     Therapeutic Exercises - Justified to address any of the following:  To develop strength, endurance, ROM and/or flexibility.   Scap depression in pull up position with cues to break the bar 20x  Standing tricep ext overhead 3# 3x10    Neuromuscular Re-Education - Justified to address any of the following:   Re-education of movement, balance, coordination, kinesthetic sense, posture and/or proprioception for sitting and/or standing activities.   Scapular PNF PD prolonged holds, irradiation with GH ext and ER  Primal push up holds to facilitate scapular stabilization and endurance for tricep extension hold in weight bearing    Manual Therapy - Justified to address any of the following:    Mobilization of joints and soft tissues, manipulation, manual lymphatic drainage, and/or manual traction.    STM bicep, wrist  flexors, triceps, pronator teres  AP GH mobs grade III-IV  STM pecs       ---      Flowsheet Row ---   Total Time    Timed Minutes 43 minutes   Total Time 43 minutes          Assessment   Focused on scapular motor control. Noted some weakness and visible shaking with scap depression holds. Also worked on elbow extension end range holds. Cued to work on ER at the shoulder with tricep activation. Very weak with overhead extension of the elbow.   Plan   Continue per POC, progress elbow ext strength      Goals      Goal 1: Improve FOTO score from 46 to 74 or better to demonstrate change for significant functional improvement.     09/14/21 progressing DL   Sessions: 32     Goal 2: Patient will demonstrate independence in prescribed HEP with proper form, sets and reps for safe discharge to an independent program.    09/14/21 pt states compliance DL   Sessions: 32     Goal 3: Patient will demonstrate elbow flexion AROM of greater than or equal to 125 degrees to allow patient to don and doff shirts without pain or compensation.  09/14/21 met DL   Sessions: 32     Goal 4: Patient will demonstrate elbow flexion strength to 90% of uninvolved limb to allow patient to return to PLOF with weightlifting.     09/14/21 progressing DL   Sessions: 32     Goal 5: Patient will demonstrate elbow extension strength to 90% of uninvolved side to allow patient to return to full work duties without pain or compensation.    09/14/21 progressing DL   Sessions: 32     Goal 6: Patient will demonstrate elbow extension AROM of greater than or equal to 0 degrees to allow patient to push up from a chair without pain.     09/14/21 progressingDL   Sessions: 32                                                Thora Lance, DPT

## 2021-09-21 ENCOUNTER — Inpatient Hospital Stay
Payer: BLUE CROSS/BLUE SHIELD | Attending: Physician Assistant | Admitting: Rehabilitative and Restorative Service Providers"

## 2021-09-21 DIAGNOSIS — M25522 Pain in left elbow: Secondary | ICD-10-CM | POA: Insufficient documentation

## 2021-09-21 NOTE — PT/OT Therapy Note (Signed)
Name: Benjamin Keith Age: 41 y.o.   Date of Service: 09/21/2021  Referring Physician: Harvest Forest, PA   Date of Injury: 06/23/2021  Date Care Plan Established/Reviewed: 09/14/2021  Date Treatment Started: 07/02/2021  Date Care Plan Established/Reviewed No data was found  Date Treatment Started No data was found  (Historic) Date Care Plan Established/Reviewed No data was found  (Historic) Date Treatment Started No data was found   End of Certification Date: 12/12/2021  Sessions in Plan of Care: 16  Surgery Date: 06/28/2021  MD Follow-up: No data was found  Medbridge Code: PFCNADNE    Visit Count: 18   Diagnosis:   1. Left elbow pain        Subjective     Daily Subjective   Pt states that he is doing well. No increased in pain. States that he is doing abot 10# with tricep exercises. States that he is continue to work on progression of overall strength. Has been doing more dumbbell chest press rather than bench.     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    Objective                     Treatment     Therapeutic Exercises - Justified to address any of the following:  To develop strength, endurance, ROM and/or flexibility.   Push up with focus on end range extnesion with cues to avoid movement at the scapula 3x10    Neuromuscular Re-Education - Justified to address any of the following:   Re-education of movement, balance, coordination, kinesthetic sense, posture and/or proprioception for sitting and/or standing activities.   End range elbow extension manually resistance to facilitate motor control into terminal extension to improve tricep activation  Weight bearing terminal elbow extension with manual resisitance to improve motor control in weight bearing    Manual Therapy - Justified to address any of the following:     Mobilization of joints and soft tissues, manipulation, manual lymphatic drainage, and/or manual traction.    STM bicep, wrist flexors, triceps, pronator teres  AP GH mobs grade Keith-IV  STM pecs       ---      Flowsheet Row ---   Total Time    Timed Minutes 45 minutes   Total Time 45 minutes          Assessment   Focused primarily on endrange extension and motor control. Pt had difficulty isolate end range tricep, tending to try and compensate for the motion. By the end was able to get improve activation and isolation. Advised to continue work on isolate activation at home in [employer] and [employer].   Plan   Continue per POC, continue to work on isolated tricep strength      Goals      Goal 1: Improve FOTO score from 46 to 74 or better to demonstrate change for significant functional improvement.     09/14/21 progressing DL   Sessions: 32     Goal 2: Patient will demonstrate independence in prescribed HEP with proper form, sets and reps for safe discharge to an independent program.    09/14/21 pt states compliance DL   Sessions: 32     Goal 3: Patient will demonstrate elbow flexion  AROM of greater than or equal to 125 degrees to allow patient to don and doff shirts without pain or compensation.     09/14/21 met DL   Sessions: 32     Goal 4: Patient will demonstrate elbow flexion strength to 90% of uninvolved limb to allow patient to return to PLOF with weightlifting.     09/14/21 progressing DL   Sessions: 32     Goal 5: Patient will demonstrate elbow extension strength to 90% of uninvolved side to allow patient to return to full work duties without pain or compensation.    09/14/21 progressing DL   Sessions: 32     Goal 6: Patient will demonstrate elbow extension AROM of greater than or equal to 0 degrees to allow patient to push up from a chair without pain.     09/14/21 progressingDL   Sessions: 32                                                Thora Lance, DPT

## 2021-09-25 ENCOUNTER — Inpatient Hospital Stay
Payer: BLUE CROSS/BLUE SHIELD | Attending: Physician Assistant | Admitting: Rehabilitative and Restorative Service Providers"

## 2021-09-25 DIAGNOSIS — M25522 Pain in left elbow: Secondary | ICD-10-CM | POA: Insufficient documentation

## 2021-09-25 NOTE — PT/OT Therapy Note (Signed)
Name: Benjamin Keith Age: 41 y.o.   Date of Service: 09/25/2021  Referring Physician: Harvest Forest, PA   Date of Injury: 06/23/2021  Date Care Plan Established/Reviewed: 09/14/2021  Date Treatment Started: 07/02/2021  Date Care Plan Established/Reviewed No data was found  Date Treatment Started No data was found  (Historic) Date Care Plan Established/Reviewed No data was found  (Historic) Date Treatment Started No data was found   End of Certification Date: 12/12/2021  Sessions in Plan of Care: 16  Surgery Date: 06/28/2021  MD Follow-up: No data was found  Medbridge Code: PFCNADNE    Visit Count: 19   Diagnosis:   1. Left elbow pain        Subjective     Daily Subjective   Pt states that he was been feeling well since last visit. States that he is working on his elbow ext with exercises at home. States that he did not have any pain in the elbow.     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    Objective                     Treatment     Therapeutic Exercises - Justified to address any of the following:  To develop strength, endurance, ROM and/or flexibility.   Push up with focus on end range extnesion with cues to avoid movement at the scapula 3x10 with banded end range extension resistance  Standing tricep ext with 5 second hold and slow eccentric 3x10  ER 10# on keiser 2x10  IR 10# on keiser 2x10    Neuromuscular Re-Education - Justified to address any of the following:   Re-education of movement, balance, coordination, kinesthetic sense, posture and/or proprioception for sitting and/or standing activities.   End range elbow extension manually resistance to facilitate motor control into terminal extension to improve tricep activation  Weight bearing terminal elbow extension with manual resisitance to improve motor control in  weight bearing  Primal push up holds     Manual Therapy - Justified to address any of the following:    Mobilization of joints and soft tissues, manipulation, manual lymphatic drainage, and/or manual traction.    STM bicep, wrist flexors, tricep      Home Exercises   Access Code: [email or web address]  URL: [email or web address]  Date: 09/25/2021  Prepared by: Ned Card    Exercises  - Standing Bicep Stretch at Wall  - 2 x daily - 7 x weekly - 1 sets - 3 reps - 30sec hold  - Standing Row with Anchored Resistance  - 1 x daily - 7 x weekly - 3 sets - 10 reps - 1 hold  - Chest Fly with Resistance Band with PLB  - 1 x daily - 7 x weekly - 3 sets - 10 reps - 1 hold  - Prone Lower Trapezius Strengthening on Swiss Ball  - 1 x daily - 7 x weekly - 3 sets - 10 reps - 1 hold  - Prone Middle Trapezius Strengthening on Swiss Ball  - 1 x daily - 7 x weekly - 3 sets - 10 reps - 1 hold  - Prone Shoulder Extension on Swiss Ball  - 1 x daily - 7 x weekly - 3 sets -  10 reps - 1 hold  - Supine Elbow Flexion Extension with Dumbbell  - 1 x daily - 7 x weekly - 3 sets - 10 reps - 1 hold  - Elbow Extension with Anchored Resistance  - 1 x daily - 7 x weekly - 3 sets - 10 reps - 1 hold  - Single Arm Tricep Extension on [employer]  - 1 x daily - 7 x weekly - 3 sets - 10 reps - 1 hold  - Push Up on Table  - 1 x daily - 7 x weekly - 3 sets - 10 reps - 1 hold       ---      Flowsheet Row ---   Total Time    Timed Minutes 43 minutes   Total Time 43 minutes          Assessment   Continued to work focus on terminal elbow extnesion. Improved ability to isolate tricep this visit. Continues to be weak and fatigue over reps due to lack of strength. Pt instructed to work on end range extension.    Plan   Continue per POC, plan of dropping to 1/xweek after this week      Goals      Goal 1: Improve FOTO score from 46 to 74 or better to demonstrate change for significant functional improvement.     09/14/21 progressing DL   Sessions: 32     Goal 2: Patient  will demonstrate independence in prescribed HEP with proper form, sets and reps for safe discharge to an independent program.    09/14/21 pt states compliance DL   Sessions: 32     Goal 3: Patient will demonstrate elbow flexion AROM of greater than or equal to 125 degrees to allow patient to don and doff shirts without pain or compensation.     09/14/21 met DL   Sessions: 32     Goal 4: Patient will demonstrate elbow flexion strength to 90% of uninvolved limb to allow patient to return to PLOF with weightlifting.     09/14/21 progressing DL   Sessions: 32     Goal 5: Patient will demonstrate elbow extension strength to 90% of uninvolved side to allow patient to return to full work duties without pain or compensation.    09/14/21 progressing DL   Sessions: 32     Goal 6: Patient will demonstrate elbow extension AROM of greater than or equal to 0 degrees to allow patient to push up from a chair without pain.     09/14/21 progressingDL   Sessions: 32                                                Thora Lance, DPT

## 2021-09-28 ENCOUNTER — Inpatient Hospital Stay
Payer: BLUE CROSS/BLUE SHIELD | Attending: Physician Assistant | Admitting: Rehabilitative and Restorative Service Providers"

## 2021-09-28 DIAGNOSIS — M25522 Pain in left elbow: Secondary | ICD-10-CM | POA: Insufficient documentation

## 2021-09-28 NOTE — PT/OT Therapy Note (Signed)
Name: Benjamin Keith Age: 41 y.o.   Date of Service: 09/28/2021  Referring Physician: Harvest Forest, PA   Date of Injury: 06/23/2021  Date Care Plan Established/Reviewed: 09/14/2021  Date Treatment Started: 07/02/2021  Date Care Plan Established/Reviewed No data was found  Date Treatment Started No data was found  (Historic) Date Care Plan Established/Reviewed No data was found  (Historic) Date Treatment Started No data was found   End of Certification Date: 12/12/2021  Sessions in Plan of Care: 16  Surgery Date: 06/28/2021  MD Follow-up: No data was found  Medbridge Code: PFCNADNE    Visit Count: 20   Diagnosis:   1. Left elbow pain        Subjective     Daily Subjective   Pt states that he is doing well.states that he has been working on end range extension at home. [facility] t that he has no issues with pain in the elbow.     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    Objective                     Treatment     Therapeutic Exercises - Justified to address any of the following:  To develop strength, endurance, ROM and/or flexibility.   Contract relax end range extension    Performed tricep extension supine 5# and push up at EOB tricep bias with Blood Flow Restriction with Delfi system at 80% PTP on L UE.  BFR utilized for improved tricep strength Explained all risks, benefits, and safety precautions prior to use.  Patient performed protocol of 30 reps followed by 3 sets of 15 for each exercise while being closely monitored for excess discomfort and for proper form.  Discussed nutritional needs after BFR training to maximize benefit.    Neuromuscular Re-Education - Justified to address any of the following:   Re-education of movement, balance, coordination, kinesthetic sense, posture and/or proprioception for sitting and/or standing  activities.   Isometric and isotonic elbow extension with end range holds to facilitate end range motor control and motor recruitment  Bear crawl on floor width of turf focus on scapular and elbow stability 2x 66   Med ball chest pass off rebounder working on Teacher, early years/pre Therapy - Justified to address any of the following:    Mobilization of joints and soft tissues, manipulation, manual lymphatic drainage, and/or manual traction.    STM biceps, wrist flexors, triceps       ---      Flowsheet Row ---   Total Time    Timed Minutes 45 minutes   Total Time 45 minutes          Assessment   Pt continues to have difficulty with motor activation in end range elbow extension. Overall strength appears to be improving. Initatied some light plyometric work to improved laoding through the elbow and improve eccentric deceleration.   Plan   Continue per POC, continue to work on end range extnesion, work on progression in strength      Goals      Goal 1: Improve FOTO score from 46 to 74 or better to demonstrate change for significant functional improvement.     09/14/21 progressing DL  Sessions: 32     Goal 2: Patient will demonstrate independence in prescribed HEP with proper form, sets and reps for safe discharge to an independent program.    09/14/21 pt states compliance DL   Sessions: 32     Goal 3: Patient will demonstrate elbow flexion AROM of greater than or equal to 125 degrees to allow patient to don and doff shirts without pain or compensation.     09/14/21 met DL   Sessions: 32     Goal 4: Patient will demonstrate elbow flexion strength to 90% of uninvolved limb to allow patient to return to PLOF with weightlifting.     09/14/21 progressing DL   Sessions: 32     Goal 5: Patient will demonstrate elbow extension strength to 90% of uninvolved side to allow patient to return to full work duties without pain or compensation.    09/14/21 progressing DL   Sessions: 32     Goal 6: Patient will demonstrate elbow  extension AROM of greater than or equal to 0 degrees to allow patient to push up from a chair without pain.     09/14/21 progressingDL   Sessions: 32                                                Thora Lance, DPT

## 2021-10-02 ENCOUNTER — Inpatient Hospital Stay: Payer: BLUE CROSS/BLUE SHIELD | Admitting: Rehabilitative and Restorative Service Providers"

## 2021-10-05 ENCOUNTER — Inpatient Hospital Stay
Payer: BLUE CROSS/BLUE SHIELD | Attending: Physician Assistant | Admitting: Rehabilitative and Restorative Service Providers"

## 2021-10-05 DIAGNOSIS — M25522 Pain in left elbow: Secondary | ICD-10-CM | POA: Insufficient documentation

## 2021-10-05 NOTE — PT/OT Therapy Note (Signed)
Name: Domingo DimesBenjamin Franklin Raudenbush III Age: 41 y.o.   Date of Service: 10/05/2021  Referring Physician: Harvest ForestMcDonough, Sean, PA   Date of Injury: 06/23/2021  Date Care Plan Established/Reviewed: 09/14/2021  Date Treatment Started: 07/02/2021  Date Care Plan Established/Reviewed No data was found  Date Treatment Started No data was found  (Historic) Date Care Plan Established/Reviewed No data was found  (Historic) Date Treatment Started No data was found   End of Certification Date: 12/12/2021  Sessions in Plan of Care: 16  Surgery Date: 06/28/2021  MD Follow-up: No data was found  Medbridge Code: PFCNADNE    Visit Count: 21   Diagnosis:   1. Left elbow pain        Subjective     Daily Subjective   Pt states that he has been doing well. Has aggravated his thigh while playing kickball. States that he has been improving with end range tricep activation. States he issues with the arm and strength is improving.     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    Objective     Active Range of Motion (degrees)     Left Elbow   Flexion: 143 degrees   Extension: -5 degrees                     Treatment     Therapeutic Exercises - Justified to address any of the following:  To develop strength, endurance, ROM and/or flexibility.   Contract relax end range extension    Performed tricep extension supine 3# and push up at EOB tricep bias with Blood Flow Restriction with Delfi system at 80% PTP on L UE.  BFR utilized for improved tricep strength Explained all risks, benefits, and safety precautions prior to use.  Patient performed protocol of 30 reps followed by 3 sets of 15 for each exercise while being closely monitored for excess discomfort and for proper form.  Discussed nutritional needs after BFR training to maximize benefit.    Neuromuscular Re-Education -  Justified to address any of the following:   Re-education of movement, balance, coordination, kinesthetic sense, posture and/or proprioception for sitting and/or standing activities.   Isometric and isotonic elbow extension with end range holds to facilitate end range motor control and motor recruitment      Manual Therapy - Justified to address any of the following:    Mobilization of joints and soft tissues, manipulation, manual lymphatic drainage, and/or manual traction.    STM biceps, wrist flexors, triceps       ---      Flowsheet Row ---   Total Time    Timed Minutes 43 minutes   Total Time 43 minutes          Assessment   Continues to lack some motion into end range and has difficulty end range muscle activation. Worked on clearing through the area and improving muscle activation. Was able to actively hold in hyper extension but had difficulty against resistance.   Plan   Continue per POC, continue working on terminal elbow extension      Goals      Goal 1: Improve FOTO score from 46 to 74 or better to demonstrate change for significant functional improvement.     09/14/21 progressing  DL   Sessions: 32     Goal 2: Patient will demonstrate independence in prescribed HEP with proper form, sets and reps for safe discharge to an independent program.    09/14/21 pt states compliance DL   Sessions: 32     Goal 3: Patient will demonstrate elbow flexion AROM of greater than or equal to 125 degrees to allow patient to don and doff shirts without pain or compensation.     09/14/21 met DL   Sessions: 32     Goal 4: Patient will demonstrate elbow flexion strength to 90% of uninvolved limb to allow patient to return to PLOF with weightlifting.     09/14/21 progressing DL   Sessions: 32     Goal 5: Patient will demonstrate elbow extension strength to 90% of uninvolved side to allow patient to return to full work duties without pain or compensation.    09/14/21 progressing DL   Sessions: 32     Goal 6: Patient will demonstrate  elbow extension AROM of greater than or equal to 0 degrees to allow patient to push up from a chair without pain.     09/14/21 progressingDL   Sessions: 32                                                Thora Lance, DPT

## 2021-10-09 ENCOUNTER — Inpatient Hospital Stay: Payer: BLUE CROSS/BLUE SHIELD | Admitting: Rehabilitative and Restorative Service Providers"

## 2021-10-12 ENCOUNTER — Inpatient Hospital Stay
Payer: BLUE CROSS/BLUE SHIELD | Attending: Physician Assistant | Admitting: Rehabilitative and Restorative Service Providers"

## 2021-10-12 DIAGNOSIS — M25522 Pain in left elbow: Secondary | ICD-10-CM | POA: Insufficient documentation

## 2021-10-12 NOTE — PT/OT Therapy Note (Signed)
Name: Benjamin Keith Age: 41 y.o.   Date of Service: 10/12/2021  Referring Physician: Harvest Forest, PA   Date of Injury: 06/23/2021  Date Care Plan Established/Reviewed: 09/14/2021  Date Treatment Started: 07/02/2021  Date Care Plan Established/Reviewed No data was found  Date Treatment Started No data was found  (Historic) Date Care Plan Established/Reviewed No data was found  (Historic) Date Treatment Started No data was found   End of Certification Date: 12/12/2021  Sessions in Plan of Care: 16  Surgery Date: 06/28/2021  MD Follow-up: No data was found  Medbridge Code: PFCNADNE    Visit Count: 22   Diagnosis:   1. Left elbow pain        Subjective     Daily Subjective   Pt states that he has been on travel this week. States that he had no issues with luggagte or lifting with the UE . States that he tends to have a stiffness in the elbow when exercises due to the elbow needing some activity to improve the feeling.     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    Objective     Strength/Myotome Testing (/5)     Left Elbow   Extension: 29.7 PSI    Right Elbow   Extension: 41.7 PSI                    Treatment     Therapeutic Exercises - Justified to address any of the following:  To develop strength, endurance, ROM and/or flexibility.   Push up at EOB with focus on end range extension holds 2x10  Rapid tricep extension  to improve power generation 3x12    Neuromuscular Re-Education - Justified to address any of the following:   Re-education of movement, balance, coordination, kinesthetic sense, posture and/or proprioception for sitting and/or standing activities.   Manually resisted end range elbow extension to faciliate motor control in end range   End range weight bearing elbow extension with active ER to facilitate end range  tricep activation and dissociation of the lats and scapular stabilizers from movement   Decel drops from 4" box focus on deceleration of force    Manual Therapy - Justified to address any of the following:    Mobilization of joints and soft tissues, manipulation, manual lymphatic drainage, and/or manual traction.    STM biceps, wrist flexors, triceps       ---      Flowsheet Row ---   Total Time    Timed Minutes 43 minutes   Total Time 43 minutes          Assessment   Significant improvement in elbow ext strength compared to last measure. Continues to lack strength on the L compared to the R. Added some deceleration drops this visit to try and improve motor control. Also rapid extensions for power and stamina.   Plan   Continue per POC, progress strength, work on pulling and pushing      Goals      Goal 1: Improve FOTO score from 46 to 74 or better to demonstrate change for significant functional improvement.     09/14/21 progressing DL   Sessions: 32     Goal 2: Patient will demonstrate independence in  prescribed HEP with proper form, sets and reps for safe discharge to an independent program.    09/14/21 pt states compliance DL   Sessions: 32     Goal 3: Patient will demonstrate elbow flexion AROM of greater than or equal to 125 degrees to allow patient to don and doff shirts without pain or compensation.     09/14/21 met DL   Sessions: 32     Goal 4: Patient will demonstrate elbow flexion strength to 90% of uninvolved limb to allow patient to return to PLOF with weightlifting.     09/14/21 progressing DL   Sessions: 32     Goal 5: Patient will demonstrate elbow extension strength to 90% of uninvolved side to allow patient to return to full work duties without pain or compensation.    09/14/21 progressing DL   Sessions: 32     Goal 6: Patient will demonstrate elbow extension AROM of greater than or equal to 0 degrees to allow patient to push up from a chair without pain.     09/14/21 progressingDL   Sessions: 32                                                 Thora Lance, DPT

## 2021-10-16 ENCOUNTER — Inpatient Hospital Stay: Payer: BLUE CROSS/BLUE SHIELD | Admitting: Rehabilitative and Restorative Service Providers"

## 2021-10-19 ENCOUNTER — Inpatient Hospital Stay
Payer: BLUE CROSS/BLUE SHIELD | Attending: Physician Assistant | Admitting: Rehabilitative and Restorative Service Providers"

## 2021-10-19 DIAGNOSIS — M25522 Pain in left elbow: Secondary | ICD-10-CM | POA: Insufficient documentation

## 2021-10-19 NOTE — PT/OT Therapy Note (Signed)
Name: Benjamin Keith Age: 41 y.o.   Date of Service: 10/19/2021  Referring Physician: Harvest Forest, PA   Date of Injury: 06/23/2021  Date Care Plan Established/Reviewed: 09/14/2021  Date Treatment Started: 07/02/2021  Date Care Plan Established/Reviewed No data was found  Date Treatment Started No data was found  (Historic) Date Care Plan Established/Reviewed No data was found  (Historic) Date Treatment Started No data was found   End of Certification Date: 12/12/2021  Sessions in Plan of Care: 16  Surgery Date: 06/28/2021  MD Follow-up: No data was found  Medbridge Code: PFCNADNE    Visit Count: 23   Diagnosis:   1. Left elbow pain        Subjective     Daily Subjective   Pt states that he has been doing good. States that week and half ago doing overhead tricep he was struggling a lot. Recently it has changed and it was not a problem with 20#s. States that he still feels the arm needs to warm up[ before getting moving.     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    Objective   Peak girth bicep  R 42.5cm  L 40cm                    Treatment     Therapeutic Exercises - Justified to address any of the following:  To develop strength, endurance, ROM and/or flexibility.   Push up at EOB with focus on end range extension holds 2x10  Elbow extension over shoulder pronated forearm 3x10  Body weight tricep push up on 2 plyos focus on scap depression 2x10  Single arm L sit shot put 3x5 alternating arms  Tall kneeling chest pass with body weight catch in push up position 2x10    Neuromuscular Re-Education - Justified to address any of the following:   Re-education of movement, balance, coordination, kinesthetic sense, posture and/or proprioception for sitting and/or standing activities.   Manually resisted end range elbow extension to  faciliate motor control in end range   Decel drops from 4" box focus on deceleration of force  Scap depression with tricep end range extension holds to improve weight bearing tolerance      Manual Therapy - Justified to address any of the following:    Mobilization of joints and soft tissues, manipulation, manual lymphatic drainage, and/or manual traction.    STM biceps, wrist flexors, triceps       ---      Flowsheet Row ---   Total Time    Timed Minutes 45 minutes   Total Time 45 minutes          Assessment   Pt continues to progress welll. End range hold is still lacking in terms of strength. Noted some visible atrophy still in the lateral head of the tricep. Worked on isolating that and advised pt to work on at home. Also continued work on plyometric strength and control. Holding elbows extended in scapular depressed position was hard for patient. However will need to be able to do that for work related duties.   Plan   Continue per POC, progress plyo and strength      Goals      Goal 1: Improve FOTO  score from 46 to 74 or better to demonstrate change for significant functional improvement.     09/14/21 progressing DL   Sessions: 32     Goal 2: Patient will demonstrate independence in prescribed HEP with proper form, sets and reps for safe discharge to an independent program.    09/14/21 pt states compliance DL   Sessions: 32     Goal 3: Patient will demonstrate elbow flexion AROM of greater than or equal to 125 degrees to allow patient to don and doff shirts without pain or compensation.     09/14/21 met DL   Sessions: 32     Goal 4: Patient will demonstrate elbow flexion strength to 90% of uninvolved limb to allow patient to return to PLOF with weightlifting.     09/14/21 progressing DL   Sessions: 32     Goal 5: Patient will demonstrate elbow extension strength to 90% of uninvolved side to allow patient to return to full work duties without pain or compensation.    09/14/21 progressing DL   Sessions: 32     Goal  6: Patient will demonstrate elbow extension AROM of greater than or equal to 0 degrees to allow patient to push up from a chair without pain.     09/14/21 progressingDL   Sessions: 32                                                Thora Lance, DPT

## 2021-10-23 ENCOUNTER — Inpatient Hospital Stay: Payer: BLUE CROSS/BLUE SHIELD | Admitting: Rehabilitative and Restorative Service Providers"

## 2021-10-26 ENCOUNTER — Inpatient Hospital Stay: Payer: BLUE CROSS/BLUE SHIELD | Admitting: Rehabilitative and Restorative Service Providers"

## 2021-11-02 ENCOUNTER — Inpatient Hospital Stay
Payer: BLUE CROSS/BLUE SHIELD | Attending: Physician Assistant | Admitting: Rehabilitative and Restorative Service Providers"

## 2021-11-02 DIAGNOSIS — M25522 Pain in left elbow: Secondary | ICD-10-CM | POA: Insufficient documentation

## 2021-11-02 NOTE — PT/OT Therapy Note (Signed)
Name: Benjamin Keith Age: 41 y.o.   Date of Service: 11/02/2021  Referring Physician: Harvest Forest, PA   Date of Injury: 06/23/2021  Date Care Plan Established/Reviewed: 09/14/2021  Date Treatment Started: 07/02/2021  Date Care Plan Established/Reviewed No data was found  Date Treatment Started No data was found  (Historic) Date Care Plan Established/Reviewed No data was found  (Historic) Date Treatment Started No data was found   End of Certification Date: 12/12/2021  Sessions in Plan of Care: 16  Surgery Date: 06/28/2021  MD Follow-up: No data was found  Medbridge Code: PFCNADNE    Visit Count: 24   Diagnosis:   1. Left elbow pain        Subjective     Daily Subjective   Pt states he has been doing well. No increase in pain in the elbow. Has been benching 275 max 5 reps without pain. States that he feels the L elbow is very good and the R elbow is "old".     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    Objective     Active Range of Motion (degrees)     Left Elbow   Extension: -3 degrees     Strength/Myotome Testing (/5)     Left Elbow   Extension: 34.5 PSI    Right Elbow   Extension: 60.5 PSI                    Treatment     Therapeutic Exercises - Justified to address any of the following:  To develop strength, endurance, ROM and/or flexibility.   Overhead carry 6kg 3x turf focus on maintaining elbow extension  Lat stretch 3x30sec  Chest stretch 3x30sec  Body press up onto 54" box focus on getting end range extension through the elbow 3x10  Unilateral chess pass in standing 12# ball 2x10 ea side    Neuromuscular Re-Education - Justified to address any of the following:   Re-education of movement, balance, coordination, kinesthetic sense, posture and/or proprioception for sitting and/or standing activities.   Electa Sniff holds with  focus on terminal elbow extension 10x10sec to facilitate end range activation and cues for scapular and core stabilization    Manual Therapy - Justified to address any of the following:    Mobilization of joints and soft tissues, manipulation, manual lymphatic drainage, and/or manual traction.    STM biceps, brachialis         ---      Flowsheet Row ---   Total Time    Timed Minutes 43 minutes   Total Time 43 minutes          Assessment   Improved strength but continues to have a deficit from the contralateral side. Tolerance to weight bearing activities in improving but end range extension is still lacking. Advised to work on at home. And added stretches to HEP for home.   Plan   Continue per POC, reassess strength and exercises progression      Goals      Goal 1: Improve FOTO score from 46 to 74 or better to demonstrate change for significant functional improvement.     09/14/21 progressing DL   Sessions: 32     Goal 2: Patient will demonstrate independence  in prescribed HEP with proper form, sets and reps for safe discharge to an independent program.    09/14/21 pt states compliance DL   Sessions: 32     Goal 3: Patient will demonstrate elbow flexion AROM of greater than or equal to 125 degrees to allow patient to don and doff shirts without pain or compensation.     09/14/21 met DL   Sessions: 32     Goal 4: Patient will demonstrate elbow flexion strength to 90% of uninvolved limb to allow patient to return to PLOF with weightlifting.     09/14/21 progressing DL   Sessions: 32     Goal 5: Patient will demonstrate elbow extension strength to 90% of uninvolved side to allow patient to return to full work duties without pain or compensation.    09/14/21 progressing DL   Sessions: 32     Goal 6: Patient will demonstrate elbow extension AROM of greater than or equal to 0 degrees to allow patient to push up from a chair without pain.     09/14/21 progressingDL   Sessions: 32                                                 Thora Lance, DPT

## 2021-11-05 ENCOUNTER — Ambulatory Visit (INDEPENDENT_AMBULATORY_CARE_PROVIDER_SITE_OTHER): Payer: BLUE CROSS/BLUE SHIELD | Admitting: Orthopaedic Surgery

## 2021-11-09 ENCOUNTER — Inpatient Hospital Stay: Payer: BLUE CROSS/BLUE SHIELD | Admitting: Rehabilitative and Restorative Service Providers"

## 2022-01-07 NOTE — PT/OT Therapy Note (Signed)
Discontinuation of Therapy Services    [facility] did not complete prescribed physical therapy visits.      Status is unknown at this time, physical therapy has been discontinued and patient has been discharged from care.  The last therapy note is below for review.    Please feel free to contact me with any questions regarding the care of [facility].    Sincerely,    Thora Lance, DPT        01/07/2022

## 2022-05-01 ENCOUNTER — Encounter (INDEPENDENT_AMBULATORY_CARE_PROVIDER_SITE_OTHER): Payer: Self-pay

## 2022-05-01 ENCOUNTER — Telehealth (INDEPENDENT_AMBULATORY_CARE_PROVIDER_SITE_OTHER): Payer: Self-pay

## 2022-05-07 ENCOUNTER — Ambulatory Visit
Admission: RE | Admit: 2022-05-07 | Discharge: 2022-05-07 | Disposition: A | Discharge status: Home / Self Care | Payer: BLUE CROSS/BLUE SHIELD | Source: Ambulatory Visit | Attending: Orthopaedic Surgery | Admitting: Orthopaedic Surgery

## 2022-05-07 ENCOUNTER — Other Ambulatory Visit: Payer: Self-pay | Admitting: Orthopaedic Surgery

## 2022-05-07 ENCOUNTER — Ambulatory Visit (INDEPENDENT_AMBULATORY_CARE_PROVIDER_SITE_OTHER): Payer: BLUE CROSS/BLUE SHIELD

## 2022-05-07 ENCOUNTER — Ambulatory Visit (INDEPENDENT_AMBULATORY_CARE_PROVIDER_SITE_OTHER): Payer: BLUE CROSS/BLUE SHIELD | Admitting: Orthopaedic Surgery

## 2022-05-07 VITALS — BP 134/85 | HR 64

## 2022-05-07 DIAGNOSIS — S46311A Strain of muscle, fascia and tendon of triceps, right arm, initial encounter: Secondary | ICD-10-CM | POA: Insufficient documentation

## 2022-05-07 DIAGNOSIS — M25521 Pain in right elbow: Secondary | ICD-10-CM

## 2022-05-07 NOTE — Progress Notes (Signed)
Benjamin Keith    CHIEF COMPLAINT:  Benjamin Keith is a 41 year old male who presents with a chief complaint of right elbow pain. He has had a history of a left elbow open distal triceps repair with augmentation. That surgery was done on 06/28/2021. He is now here with right elbow pain.    Patient's left elbow is doing well and he is back to 100 percent.    Patient's right elbow pain is similar to the pain he had in his left elbow before he tore it. He has a bone spur. He has pain when he is lifting. 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 He is not going to slow down on lifting. The bump does not go away with physical therapy. He was doing pull-ups in his triceps last week and felt like his elbow was going to rip away. He plans to return to the field in 08/2021 or 09/2021.    For other past medical history, social history, family history and past surgical history please see them listed below.    ROS   Constitutional: No fatigue, fever, weight loss, or weight gain.    Ears, Nose, Mouth & Throat: No sore throat or hearing loss.    Cardiovascular: No chest pain, blood clots, or leg cramps.    Respiratory: No shortness of breath, cough, or difficulty breathing.    Gastrointestinal: No nausea, vomiting, diarrhea, or loss of appetite.    Genitourinary: No polyuria or kidney disease.    Musculoskeletal: No joint aches, muscle weakness or swelling of joints/body parts other than that mentioned above.   Integumentary: No finger nail changes or skin dryness.    Neurological: No numbness, burning discomfort, or headaches.    Psychiatric: No depression or anxiety.    Endocrine: No increased thirst, change in appetite or thyroid disease.    Hematologic/Lymphatic: No easy bruising or anemia.    EXAM  General: The patient was awake, alert, oriented, easily engaged, displayed logical thinking with clear speech and was neat in appearance. Their general appearance was normal, well-developed and  well-nourished.   Right:   Skin Intact   Erythema None present   Swelling None present   Atrophy No identifiable   TTP at Medial epicondyle: None   TTP at Lateral epicondyle: None   TTP Olecranon: None   Other Areas of TTP Pain at the triceps insertion   ROM: 0-145   Strength: WNL   Pain with resisted wrist extension: Negative   Pain with resisted finger extension: Negative   Pain with resisted supination: Negative   Pain with resisted wrist flexion: Negative   Pain with resisted finger flexion: Negative   Pain with resisted pronation: Negative  Ulnar tinnel sign: Negative   Terminal flexion sign: Negative   Radial Pulse 2+   DTR and Pathological Reflexes Intact   No lymphadenopathy   Radial/Median/Ulnar Nerves intact to motor and sensory provocation   Hand Perfused with CR <2 sec     ASSESSMENT/PLAN: Benjamin Keith is a 41 year old male with right elbow pain.   We discussed the option of PRP injection. I will refer him to one of my partners for an ultrasound-guided PRP injection to initiate a healing response to tendons. I will get an MRI to look at the tendon.    The review of the patient's medications does not in any way constitute an endorsement, by this clinician, of their use, dosage, indications, route, efficacy, interactions, or other  clinical parameters    The patient's diagnosis and treatment options were discussed in detail at today's visit. We reviewed the standard conservative and surgical management of their diagnosis as well as continuing to take prescription-based anti-inflammatories and adding Tylenol, if needed.     30 minutes was spent in care of the patient at today's visit     Hermelinda Dellen MD, FAAOS, [facility]    Transcribed for Dr. Beverely Pace, by Otilio Jefferson on 05/07/2022 at 11:09 a.m. Powered by [employer].

## 2022-05-21 ENCOUNTER — Encounter (INDEPENDENT_AMBULATORY_CARE_PROVIDER_SITE_OTHER): Payer: Self-pay | Admitting: Family Medicine

## 2022-05-21 ENCOUNTER — Ambulatory Visit (INDEPENDENT_AMBULATORY_CARE_PROVIDER_SITE_OTHER): Payer: BLUE CROSS/BLUE SHIELD | Admitting: Family Medicine

## 2022-05-21 VITALS — BP 126/82 | HR 87 | Temp 98.0°F | Wt 213.0 lb

## 2022-05-21 DIAGNOSIS — Z800 Family history of malignant neoplasm of digestive organs: Secondary | ICD-10-CM

## 2022-05-21 DIAGNOSIS — Z0000 Encounter for general adult medical examination without abnormal findings: Secondary | ICD-10-CM

## 2022-05-21 LAB — COMPREHENSIVE METABOLIC PANEL
ALT: 46 U/L (ref 0–55)
AST (SGOT): 43 U/L — ABNORMAL HIGH (ref 5–41)
Albumin/Globulin Ratio: 1.4 (ref 0.9–2.2)
Albumin: 4.1 g/dL (ref 3.5–5.0)
Alkaline Phosphatase: 55 U/L (ref 37–117)
Anion Gap: 8 (ref 5.0–15.0)
BUN: 22 mg/dL (ref 9.0–28.0)
Bilirubin, Total: 0.4 mg/dL (ref 0.2–1.2)
CO2: 28 mEq/L (ref 17–29)
Calcium: 9.2 mg/dL (ref 8.5–10.5)
Chloride: 104 mEq/L (ref 99–111)
Creatinine: 1.3 mg/dL (ref 0.5–1.5)
Globulin: 2.9 g/dL (ref 2.0–3.6)
Glucose: 93 mg/dL (ref 70–100)
Potassium: 4.5 mEq/L (ref 3.5–5.3)
Protein, Total: 7 g/dL (ref 6.0–8.3)
Sodium: 140 mEq/L (ref 135–145)
eGFR: >60 mL/min/{1.73_m2} (ref >=60)

## 2022-05-21 LAB — CBC AND DIFFERENTIAL
Absolute NRBC: 0 10*3/uL (ref 0.00–0.00)
Basophils Absolute Automated: 0.02 10*3/uL (ref 0.00–0.08)
Basophils Automated: 0.5 %
Eosinophils Absolute Automated: 0.05 10*3/uL (ref 0.00–0.44)
Eosinophils Automated: 1.2 %
Hematocrit: 46.6 % (ref 37.6–49.6)
Hgb: 15.4 g/dL (ref 12.5–17.1)
Immature Granulocytes Absolute: 0 10*3/uL (ref 0.00–0.07)
Immature Granulocytes: 0 %
Instrument Absolute Neutrophil Count: 3.04 10*3/uL (ref 1.10–6.33)
Lymphocytes Absolute Automated: 0.65 10*3/uL (ref 0.42–3.22)
Lymphocytes Automated: 15.7 %
MCH: 29.8 pg (ref 25.1–33.5)
MCHC: 33 g/dL (ref 31.5–35.8)
MCV: 90.1 fL (ref 78.0–96.0)
MPV: 12.6 fL — ABNORMAL HIGH (ref 8.9–12.5)
Monocytes Absolute Automated: 0.38 10*3/uL (ref 0.21–0.85)
Monocytes: 9.2 %
Neutrophils Absolute: 3.04 10*3/uL (ref 1.10–6.33)
Neutrophils: 73.4 %
Nucleated RBC: 0 /100 WBC (ref 0.0–0.0)
Platelets: 158 10*3/uL (ref 142–346)
RBC: 5.17 10*6/uL (ref 4.20–5.90)
RDW: 12 % (ref 11–15)
WBC: 4.14 10*3/uL (ref 3.10–9.50)

## 2022-05-21 LAB — URINALYSIS, REFLEX TO MICROSCOPIC EXAM IF INDICATED
Bilirubin, UA: NEGATIVE
Blood, UA: NEGATIVE
Glucose, UA: NEGATIVE
Ketones UA: NEGATIVE
Leukocyte Esterase, UA: NEGATIVE
Nitrite, UA: NEGATIVE
Protein, UR: NEGATIVE
Specific Gravity UA: 1.028 (ref 1.001–1.035)
Urine pH: 6 (ref 5.0–8.0)
Urobilinogen, UA: NORMAL mg/dL

## 2022-05-21 LAB — LIPID PANEL
Cholesterol / HDL Ratio: 4 Index
Cholesterol: 167 mg/dL (ref 0–199)
HDL: 42 mg/dL (ref 40–9999)
LDL Calculated: 118 mg/dL — ABNORMAL HIGH (ref 0–99)
Triglycerides: 34 mg/dL (ref 34–149)
VLDL Calculated: 7 mg/dL — ABNORMAL LOW (ref 10–40)

## 2022-05-21 LAB — HEMOLYSIS INDEX: Hemolysis Index: 12 Index (ref 0–24)

## 2022-05-21 LAB — TSH: TSH: 1.12 u[IU]/mL (ref 0.35–4.94)

## 2022-05-21 LAB — HEMOGLOBIN A1C
Average Estimated Glucose: 119.8 mg/dL
Hemoglobin A1C: 5.8 % — ABNORMAL HIGH (ref 4.6–5.6)

## 2022-05-21 NOTE — Progress Notes (Signed)
Have you seen any specialists/other providers since your last visit with [place]?    Yes  Ortho- Dr Beverely Pace, scheduled for R tricep repair Thursday    The patient was informed that the following HM items are still outstanding:   Health Maintenance Due   Topic Date Due    Tetanus Ten-Year  Never done    HEPATITIS C SCREENING  Never done

## 2022-05-21 NOTE — Progress Notes (Signed)
Subjective:      Patient ID: Benjamin Keith is a 41 y.o. male.    Chief Complaint:  Chief Complaint   Patient presents with    Annual Exam     Patient is fasting.       HPI    The patient presents for a physical exam.    The patient recently had triceps surgery and states he had been lifting weights in the gym at his home for years prior to his injury and thinks the weightlifting could have been the reason for his triceps injury and surgery.  He has hemorrhoids intermittently from lifting heavy weights.  He has plans for surgery on his opposite side Thursday, 05/23/2022 for preventive reasons.  He states something went out in the middle of lifting previously and a knot will be removed and the area cleaned up.  He states he will be able to go back to lifting afterwards.    He states he has hemorrhoids intermittently due to weight lifting and does not have issues currently.  He knows of sitz baths.      The patient would like to have blood work today and is fasting.      The patient has not had a colonoscopy yet.    He sees a dermatologist for skin spots that he states has been a 5-7 year deal.  He has done light therapy and Opzelura.  He states the skin spots are not coming as much and the plan has been to get rid of them.    He gets his teeth cleaned every six months.  He has had a filling placed.      The patient performs self-testicular exams occasionally at home when washing and he denies having any concerns.    Family history:  He has a history of cancer in his uncle and grandfather and believes one may have had colon cancer and one may have had  pancreatic cancer in the ages of possibly mid-40s and the other was older.  He states his brother, sister, mom and dad have no history of cancers.      Problem List:  Patient Active Problem List   Diagnosis   (none) - all problems resolved or deleted         Current Medications:  Outpatient Medications Marked as Taking for the 05/21/22 encounter (Office  Visit) with Talmage Nap, DO   Medication Sig Dispense Refill    doxycycline (ORACEA) 40 MG capsule Take 1 capsule (40 mg) by mouth every morning         Allergies:  No Known Allergies    Past Medical History:  History reviewed. No pertinent past medical history.    Past Surgical History:  Past Surgical History:   Procedure Laterality Date    APPENDECTOMY (OPEN)      HERNIA REPAIR      REPAIR, TRICEP TENDON Left 06/28/2021       Family History:  History reviewed. No pertinent family history.    Social History:  Social History     Tobacco Use    Smoking status: Never     Passive exposure: Never    Smokeless tobacco: Never   Vaping Use    Vaping Use: Never used   Substance Use Topics    Alcohol use: Never    Drug use: Never          The following sections were reviewed this encounter by the provider:  Review of Systems   Constitutional:  Negative for appetite change, chills, fatigue, fever and unexpected weight change.   HENT:  Negative for congestion, sinus pressure, sinus pain and tinnitus.    Eyes:  Negative for photophobia, itching and visual disturbance.   Respiratory:  Negative for cough, chest tightness, shortness of breath and wheezing.    Cardiovascular:  Negative for chest pain, palpitations and leg swelling.   Gastrointestinal:  Negative for abdominal distention, abdominal pain, blood in stool, constipation, diarrhea, nausea and vomiting.   Endocrine: Negative for polydipsia, polyphagia and polyuria.   Genitourinary:  Negative for flank pain, frequency and urgency.   Musculoskeletal:  Negative for gait problem, joint swelling and neck stiffness.   Skin:  Negative for rash and wound.   Allergic/Immunologic: Negative for environmental allergies.   Neurological:  Negative for dizziness, tremors, seizures, syncope, light-headedness and headaches.   Psychiatric/Behavioral:  Negative for confusion and sleep disturbance.           Objective:   Vitals:  BP 126/82 (BP Site: Right arm, Patient  Position: Sitting)   Pulse 87   Temp 98 F (36.7 C)   Wt 96.6 kg (213 lb)   SpO2 97%   BMI 29.71 kg/m     Physical Exam  Vitals and nursing note reviewed.   Constitutional:       General: He is not in acute distress.     Appearance: Normal appearance. He is not ill-appearing.   HENT:      Head: Normocephalic and atraumatic.      Right Ear: Tympanic membrane and external ear normal.      Left Ear: Tympanic membrane and external ear normal.      Nose: Nose normal. No congestion.      Mouth/Throat:      Mouth: Mucous membranes are moist.      Pharynx: Oropharynx is clear.   Eyes:      Extraocular Movements: Extraocular movements intact.      Conjunctiva/sclera: Conjunctivae normal.      Pupils: Pupils are equal, round, and reactive to light.   Cardiovascular:      Rate and Rhythm: Normal rate and regular rhythm.      Pulses: Normal pulses.      Heart sounds: Normal heart sounds. No murmur heard.     No gallop.   Pulmonary:      Effort: Pulmonary effort is normal. No respiratory distress.      Breath sounds: Normal breath sounds. No wheezing.   Chest:      Chest wall: No tenderness.   Abdominal:      General: Abdomen is flat. Bowel sounds are normal. There is no distension.      Palpations: Abdomen is soft. There is no mass.      Tenderness: There is no abdominal tenderness. There is no right CVA tenderness or left CVA tenderness.      Hernia: No hernia is present. There is no hernia in the left inguinal area or right inguinal area.   Genitourinary:     Penis: Normal and circumcised.       Testes: Normal.   Musculoskeletal:         General: No swelling or tenderness. Normal range of motion.      Cervical back: Normal range of motion and neck supple. No muscular tenderness.   Skin:     General: Skin is warm and dry.      Capillary Refill: Capillary refill takes less than  2 seconds.      Coloration: Skin is not jaundiced.   Neurological:      General: No focal deficit present.      Mental Status: He is alert and  oriented to person, place, and time. Mental status is at baseline.      Cranial Nerves: No cranial nerve deficit.      Sensory: No sensory deficit.      Motor: No weakness.      Coordination: Coordination normal.   Psychiatric:         Mood and Affect: Mood normal.         Behavior: Behavior normal.         Thought Content: Thought content normal.         Judgment: Judgment normal.      .Depression Screeninng  PHQ-2 Questions  Question 1 - Little interest:    Question 2 - Feeling down:     PHQ-2 Total Score:  0     Results:  Labs were reviewed with the patient.    His blood work in the beginning of 05/2020 revealed he was prediabetic.  His kidney function was okay with filtration of kidneys above 60.  One of his liver functions was a little bit high at 50 with high end of normal being 34. He had no signs of anemia or blood loss.  His cholesterol was fairly good for his age.  His bad cholesterol was 127, about twenty points above where it needs to be.  His thyroid function was normal.  His white blood cell count was normal.      Assessment/Plan:       1. Well adult exam  Increase vegetables, fruits, and fiber in your diet.  Exercise at least 30 minutes 5 days per week.   Always wear your seatbelt in the car.  Wear sunscreen that is broad-spectrum (UVA/UVB) and at least SPF 30.  Avoid alcohol  drinking  And driving  Goal Blood Pressure <140/90  Schedule dental exam and cleaning every 6 months  Have your vision checked every 1-2 years.       2. Negative depression screening      Family history of colon and pancreatic cancer  Discussed age-appropriate colonoscopy screening and recommended to see GI specialist for establishment.      Health maintenance   Discussed age-appropriate routine testicular cancer screening with PSA blood work and continued regular self-testicular checks.  Recommended regular dental visits every six months for oral cancer screening.    Skin spots  Recommended continued regular followup with  dermatology.    Hemorrhoids  Discussed treatment and recommended sitz baths in the toilet for 15-20 minutes after his showers, especially after weight lifting.    Ear wax  Recommended to use over-the-counter Debrox every four weeks.     Prediabetic status  Encouraged healthy diet.  Lab work to be repeated today.    Laboratory  Order for fasting blood work today and to obtain urine.    See me once yearly for annual preventative visit.      Risk & Benefits of the present/new medication(s) were explained to the patient (and family) who verbalized understanding & agreed to the treatment plan. Patient (family) encouraged to contact me/clinical staff with any questions/concerns   Discussed s/sx's to watch out for that would warrant immediate re-evaluation.  FU if symptoms worsen/persist.    Patient to call if symptoms worsen/persist.       Body mass index is  29.71 kg/m.   Discussed the patient's BMI with him.  The BMI is in the acceptable range.    Nutrition Counseling:  Food education, guidance and counseling  Physical Activity Counseling  Strength training exercise promotion     Patient verbalized understanding & agreed to the treatment plan. Patient encouraged to contact me/clinical staff with any questions/concerns      Talmage Nap, DO

## 2022-05-23 ENCOUNTER — Encounter (INDEPENDENT_AMBULATORY_CARE_PROVIDER_SITE_OTHER): Payer: Self-pay

## 2022-05-23 ENCOUNTER — Ambulatory Visit (AMBULATORY_SURGERY_CENTER): Payer: BLUE CROSS/BLUE SHIELD | Admitting: Orthopaedic Surgery

## 2022-05-23 DIAGNOSIS — S46311A Strain of muscle, fascia and tendon of triceps, right arm, initial encounter: Secondary | ICD-10-CM

## 2022-05-23 MED ORDER — ONDANSETRON 4 MG PO TBDP
4.0000 mg | ORAL_TABLET | Freq: Three times a day (TID) | ORAL | 0 refills | Status: DC | PRN
Start: 2022-05-23 — End: 2022-08-12

## 2022-05-23 MED ORDER — OXYCODONE HCL 5 MG PO TABS
5.0000 mg | ORAL_TABLET | ORAL | 0 refills | Status: DC | PRN
Start: 2022-05-23 — End: 2022-08-12

## 2022-05-23 NOTE — Progress Notes (Signed)
CBC (complete blood count) is within acceptable limits.  There is no evidence of anemia, white blood cell count and platelet count are within acceptable limits.  CMP (comprehensive metabolic panel):kidney function, electrolytes, and glucose are within acceptable limits.  TSH (thyroid function) is within acceptable limits.   A1C (3 month sugar average) is in the prediabetes range.  Liver function tests (ALT and/or AST) are elevated.  Please improve diet  LDL remains elevated. Please improve diet and increase exercise. Recheck in 6 months

## 2022-05-28 ENCOUNTER — Other Ambulatory Visit (INDEPENDENT_AMBULATORY_CARE_PROVIDER_SITE_OTHER): Payer: Self-pay | Admitting: Physician Assistant

## 2022-05-28 DIAGNOSIS — S46311A Strain of muscle, fascia and tendon of triceps, right arm, initial encounter: Secondary | ICD-10-CM

## 2022-05-31 ENCOUNTER — Encounter (INDEPENDENT_AMBULATORY_CARE_PROVIDER_SITE_OTHER): Payer: Self-pay | Admitting: Orthopaedic Surgery

## 2022-06-03 ENCOUNTER — Encounter: Payer: Self-pay | Admitting: Rehabilitative and Restorative Service Providers"

## 2022-06-03 ENCOUNTER — Inpatient Hospital Stay
Payer: BLUE CROSS/BLUE SHIELD | Attending: Orthopaedic Surgery | Admitting: Rehabilitative and Restorative Service Providers"

## 2022-06-03 ENCOUNTER — Ambulatory Visit (INDEPENDENT_AMBULATORY_CARE_PROVIDER_SITE_OTHER): Payer: BLUE CROSS/BLUE SHIELD | Admitting: Orthopaedic Surgery

## 2022-06-03 VITALS — BP 157/88 | HR 67

## 2022-06-03 DIAGNOSIS — M25521 Pain in right elbow: Secondary | ICD-10-CM

## 2022-06-03 NOTE — Progress Notes (Signed)
Name:Jermarion Roby Lofts III Age: 42 y.o.   Date of Service: 06/03/2022  Referring Physician: Hermelinda Dellen, MD   Date of Injury: No data found 05/23/2022  PT Date Care Plan Established/Reviewed:06/03/2022  PT Date Treatment Started:06/03/2022  OT Date Care Plan Established/Reviewed: No data found  OT Date Treatment Started: No data found    (Historic) Date of Injury:No data was found  (Historic) Date Care Plan Established/Reviewed No data was found  No data was found  (Historic) Date Treatment Started No data was found No data was found    End of Certification Date: 08/31/2022  Sessions in Plan of Care: 24  Surgery Date: 05/23/2022  MD Follow-up: No data was found  Medbridge Code: No data was found    Visit Count: 1   Diagnosis:    Diagnosis ICD-10-CM Associated Order   1. Pain in right elbow  M25.521           Subjective     History of Present Illness   History of Present Illness: Pt states having a history of R elbow pain. States in December started to feel more discomfort in the R elbow. Had f/u with Dr Beverely Pace. Had MRI showed a bone spur in the region of the triceps. Had surgery for debridement on 05/23/22. Pt states he was [place] home without issue. No N/T in the UE.   Functional Limitations (PLOF): Difficulty carrying with the R UE (no issue), difficulty pushing with the R UE (no issue), difficulty pushing up from floor (no issue), difficulty bending elbow (no issue)    Pain   Location: R elbow    Social Support/Occupation    Lives in: multiple level home    Lives with: young children  spouse    Occupation: Patent examiner for the [employer]           Precautions: hypertension  MD order/protocol  Allergies: Patient has no known allergies.    History reviewed. No pertinent past medical history.    Objective     Integumentary   surgical incisions healing without signs of infection    Active Range of Motion (degrees)   Cervical/Thoracic Spine   Normal active range of motion    Left Shoulder   Flexion: 164 degrees    Abduction: 170 degrees   External rotation 90: 82 degrees   Internal rotation 90: 75 degrees     Right Shoulder   Flexion: 146 degrees     Left Elbow   Flexion: 141 degrees   Extension: -2 degrees     Passive Range of Motion (degrees)      Right Elbow   Flexion: 126 degrees   Extension: -8 degrees     Strength/Myotome Testing (/5)     Left Shoulder     Planes of Motion   Flexion: 5   Extension: 5   Abduction: 5   External rotation at 0 (C5): 5   Internal rotation at 0: 5     Left Elbow   Flexion: 5  Extension: 5    Additional Strength Details  R shoulder and elbow not tested due to surgical precaution    Neurological Testing     Sensation     Elbow   Left Elbow   Intact: light touch    Right Elbow   Intact: light touch             BP: 157/88 Heart Rate: 67    Treatment     Manual Therapy - Justified  to address any of the following:    Mobilization of joints and soft tissues, manipulation, manual lymphatic drainage, and/or manual traction.    STM biceps, brachialis  STM wrist flexors, wrist extensors, triceps       ---      Flowsheet Row ---   Total Time    Timed Minutes 10 minutes   Untimed Minutes 30 minutes   Total Time 40 minutes          Assessment   Octavia is a 42 y.o. male presenting with R elbow pain s/p debridement who requires skilled Physical Therapy services.    Clinical presentation: evolving - surgical restriction that will be lifted after MD clearance  Barriers to therapy: Patient's age - delayed healing  Mechanism of injury/illness/exacerbation insidious onset with no known mechanism    Impairments: Pain that limits and interferes with functional ability  Decreased range of motion  Decreased strength  Decreased joint mobility  Decreased soft tissue mobility  Decreased/impaired motor control  Functional Limitations (PLOF): Difficulty carrying with the R UE (no issue), difficulty pushing with the R UE (no issue), difficulty pushing up from floor (no issue), difficulty bending elbow (no  issue)  Prognosis: excellent  Patient is aware of diagnosis, prognosis and consents to plan of care: Yes  Plan   Visits per week: 2  Number of Sessions: 24  Direct One on One  97110: Therapeutic Exercise: To Develop Strength and Endurance, ROM and Flexibility  [phone]: Neuromuscular Reeducation (Proprioceptive Neuromuscular Facilitation)  97140: Manual Therapy techniques (mobilization, manipulation, manual traction) (Grade I-V to elbow, shoulder, thoracic spine, cervical spine, and regionally interdependent joints, soft tissue mobilization, instrument assisted soft tissue mobilization.)  97530: Therapeutic Activities: Dynamic activities to improve functional performance  Dry Needling  Supervised Modalities  97010: Thermal modalities: hot/cold packs  97014: Electrical stimulation  97016: Vasopneumatic devices  Plan for Next Session -: collect FOTO      Goals      Goal 1: Improve elbow extension strength 5/5 to transfer out of chair or push open doors with involved side.   Sessions: 24      Goal 2: Patient will demonstrate independence in prescribed HEP with proper form, sets and reps for safe discharge to an independent program.   Sessions: 24      Goal 3: Improve elbow flexion strength 5/5 to be able to lift household items without increased pain   Sessions: 24      Goal 4: Increase elbow flexion to 140 degrees to allow for eating with utensils on involved side.   Sessions: [place] Tammi Boulier, DPT

## 2022-06-04 ENCOUNTER — Encounter (INDEPENDENT_AMBULATORY_CARE_PROVIDER_SITE_OTHER): Payer: Self-pay | Admitting: Physician Assistant

## 2022-06-04 ENCOUNTER — Other Ambulatory Visit (INDEPENDENT_AMBULATORY_CARE_PROVIDER_SITE_OTHER): Payer: Self-pay

## 2022-06-04 ENCOUNTER — Encounter: Payer: Self-pay | Admitting: Rehabilitative and Restorative Service Providers"

## 2022-06-04 ENCOUNTER — Ambulatory Visit (INDEPENDENT_AMBULATORY_CARE_PROVIDER_SITE_OTHER): Payer: BLUE CROSS/BLUE SHIELD | Admitting: Physician Assistant

## 2022-06-04 VITALS — BP 123/84 | HR 69 | Ht 71.0 in | Wt 207.0 lb

## 2022-06-04 DIAGNOSIS — M25521 Pain in right elbow: Secondary | ICD-10-CM | POA: Insufficient documentation

## 2022-06-04 DIAGNOSIS — Z4789 Encounter for other orthopedic aftercare: Secondary | ICD-10-CM

## 2022-06-04 DIAGNOSIS — S46311A Strain of muscle, fascia and tendon of triceps, right arm, initial encounter: Secondary | ICD-10-CM

## 2022-06-04 NOTE — Progress Notes (Signed)
[facility]    Provider: Ardath Sax, PA  Date of Exam:  06/04/2022   Patient:  Benjamin Keith  DOB:  06/12/1980    AGE:  42 y.o.  MR#:  [id number]     Diagnosis: s/p Right open triceps tendon debridement and repair with augmentation. DOS: 05/23/2022    HPI: Benjamin Keith returns, 12 days s/p above described procedure.  The patient is doing well in the postoperative timeframe.  He has continued to wear his brace as directed in the postoperative timeframe.  He states his pain has been well-controlled.  He just started formal physical therapy.  Benjamin Keith is here today for his first regularly scheduled post-operative visit.    For other past medical history, social history, family history and past surgical history please see them listed below.    Problem List:   Patient Active Problem List   Diagnosis   (none) - all problems resolved or deleted     Past Medical History:  History reviewed. No pertinent past medical history.    Social History:   Social History     Tobacco Use    Smoking status: Never     Passive exposure: Never    Smokeless tobacco: Never   Vaping Use    Vaping Use: Never used   Substance Use Topics    Alcohol use: Never    Drug use: Never     Family History: History reviewed. No pertinent family history.    Past Surgical History:    Past Surgical History:   Procedure Laterality Date    APPENDECTOMY (OPEN)      HERNIA REPAIR      REPAIR, TRICEP TENDON Left 06/28/2021       Medications:  Scheduled Meds:  Continuous Infusions:  PRN Meds:.     Allergies:  No Known Allergies    ROS:  Constitutional: No fatigue, fever, weight loss, or weight gain.   Ears, Nose, Mouth & Throat: No sore throat or hearing loss.   Cardiovascular: No chest pain, blood clots, or leg cramps.   Respiratory: No shortness of breath, cough, or difficulty breathing.   Gastrointestinal: No nausea, vomiting, diarrhea, or loss of appetite.   Genitourinary: No polyuria or kidney disease.   Musculoskeletal: No joint aches,  muscle weakness or swelling of joints/body parts other than that mentioned above.  Integumentary: No finger nail changes or skin dryness.   Neurological: No numbness, burning discomfort, or headaches.   Psychiatric: No depression or anxiety.   Endocrine: No increased thirst, change in appetite or thyroid disease.   Hematologic/Lymphatic: No easy bruising or anemia.     Exam:   Right elbow exam:  Skin incision healing well with no surrounding erythema  Full range of motion of elbow, extension, flexion, pronation and supination  Non TTP  Radial Pulse 2+  DTR and Pathological Reflexes Intact  No lymphadenopathy  Radial/Median/Ulnar Nerves intact to motor and sensory provocation  Hand Perfused with CR <2 sec     Vitals: BP 123/84 (BP Site: Left arm, Patient Position: Sitting, Cuff Size: X-Large)   Pulse 69   Ht 1.803 m (5\' 11" )   Wt 93.9 kg (207 lb)   BMI 28.87 kg/m     General: Benjamin Keith was awake, alert, oriented, easily engaged, displayed logical thinking with clear speech and was neat in appearance. His general appearance was normal, well-developed and well-nourished.    Gait: The patient demonstrated non antalgic gait with intact coordination and balance.  Studies: No new studies obtained on today's visit.     Assessment/Plan: Benjamin Keith is doing well overall in the postoperative time.  Skin incision is well-healed.  He has full range of motion of his elbow.  He is continue to wear his elbow range of motion brace as directed for 1 week and then we will begin to wean from it as tolerated.  He recently started formal physical therapy notes that this is going well.  Sutures were removed and wound care instructions and precautions were given. The patient will continue physical therapy and therapeutic exercises per protocol.   Patient was instructed on the dos and don'ts regarding protection of the surgical procedure, as well as appropriate activity levels going forward. Benjamin Keith understands the importance of a  dedicated therapy program. The patient will notify my clinic of any changes or worsening of their symptoms during the interim. We will plan on follow up in 4 weeks.  We have reviewed the operative pictures with Benjamin Keith and all the patient's questions were answered appropriately and completely. Benjamin Keith understands the plan.     Plan:  Continue PT  F/u with Dr Beverely Pace in 4 weeks.    The review of the patient's medications does not in any way constitute an endorsement, by this clinician,  of their use, dosage, indications, route, efficacy, interactions, or other clinical parameters.    This note may have been generated in part or in whole within the EP[employer] EMR using Dragon medical speech recognition software and may contain inherent errors or omissions not intended by the user. Grammatical and punctuation errors, random word insertions, deletions, pronoun errors and incomplete sentences are occasional consequences of this technology due to software limitations. Not all errors are caught or corrected.  Although every attempt is made to root out erroneus and incomplete transcription, the note may still not fully represent the intent or opinion of the author. If there are questions or concerns about the content of this note or information contained within the body of this dictation they should be addressed directly with the author for clarification.     Irish Elders, PA, have personally performed the history, physical exam and medical decision making; and confirmed the accuracy of the information in the transcribed note.    Date: 06/04/2022 Time: 9:29 AM

## 2022-06-06 ENCOUNTER — Inpatient Hospital Stay
Payer: BLUE CROSS/BLUE SHIELD | Attending: Orthopaedic Surgery | Admitting: Rehabilitative and Restorative Service Providers"

## 2022-06-06 DIAGNOSIS — M25521 Pain in right elbow: Secondary | ICD-10-CM

## 2022-06-06 NOTE — PT/OT Therapy Note (Signed)
Name: Benjamin Keith Age: 42 y.o.   Date of Service: 06/06/2022  Referring Physician: Faustino Congress, MD   Date of Injury: No data found 05/23/2022  PT Date Care Plan Established/Reviewed:06/03/2022  PT Date Treatment Started:06/03/2022  OT Date Care Plan Established/Reviewed: No data found  OT Date Treatment Started: No data found    (Historic) Date of Injury:No data was found  (Historic) Date Care Plan Established/Reviewed No data was found  No data was found  (Historic) Date Treatment Started No data was found No data was found    End of Certification Date: 09/28/979  Sessions in Plan of Care: 24  Surgery Date: 05/23/2022  MD Follow-up: No data was found  Medbridge Code: No data was found    Visit Count: 2   Diagnosis:    Diagnosis ICD-10-CM Associated Order   1. Pain in right elbow  M25.521                Subjective     Daily Subjective   Pt states that he has been doing very well. No increase in pain. States he has to remind himself not to use the UE. States that he has no soreness or pain.     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    Objective                     Treatment     Therapeutic Exercises - Justified to address any of the following:  To develop strength, endurance, ROM and/or flexibility.   Manual stretch into end range extension of the elbow    Performed supine shoulder flexion, wrist extension in sitting 5# and tricep ext isometric in standing with Blood Flow Restriction with Delfi system at 50% PTP on R UE.  BFR utilized for improved UE strength Explained all risks, benefits, and safety precautions prior to use.  Patient performed protocol of 30 reps followed by 3 sets of 15 for each exercise while being closely monitored for excess discomfort and for proper form.  Discussed nutritional  needs after BFR training to maximize benefit.            Manual Therapy - Justified to address any of the following:    Mobilization of joints and soft tissues, manipulation, manual lymphatic drainage, and/or manual traction.    STM biceps, triceps, wrist flexors, extensors, pronator teres, supinator       ---      Flowsheet Row ---   Total Time    Timed Minutes 45 minutes   Total Time 45 minutes          Assessment   Lacked some end range extension of the elbow so worked through soft tissue and able to get into extension. Used BFR to improve hypertrophy in the UE. Advised to avoid use of the UE at this time to allow for healing of the surgical site.   Plan   Continue per POC, continue work on elbow ROM and BFR      Goals      Goal 1: Improve elbow extension strength 5/5 to transfer out of chair or push open doors with involved side.   Sessions: 24  Goal 2: Patient will demonstrate independence in prescribed HEP with proper form, sets and reps for safe discharge to an independent program.   Sessions: 24      Goal 3: Improve elbow flexion strength 5/5 to be able to lift household items without increased pain   Sessions: 24      Goal 4: Increase elbow flexion to 140 degrees to allow for eating with utensils on involved side.   Sessions: [place] Charmelle Soh, DPT

## 2022-06-11 ENCOUNTER — Inpatient Hospital Stay
Payer: BLUE CROSS/BLUE SHIELD | Attending: Orthopaedic Surgery | Admitting: Rehabilitative and Restorative Service Providers"

## 2022-06-11 DIAGNOSIS — M25521 Pain in right elbow: Secondary | ICD-10-CM | POA: Insufficient documentation

## 2022-06-11 NOTE — PT/OT Therapy Note (Signed)
Name: Benjamin Keith Age: 42 y.o.   Date of Service: 06/11/2022  Referring Physician: Faustino Congress, MD   Date of Injury: No data found 05/23/2022  PT Date Care Plan Established/Reviewed:06/03/2022  PT Date Treatment Started:06/03/2022  OT Date Care Plan Established/Reviewed: No data found  OT Date Treatment Started: No data found    (Historic) Date of Injury:No data was found  (Historic) Date Care Plan Established/Reviewed No data was found  No data was found  (Historic) Date Treatment Started No data was found No data was found    End of Certification Date: 07/06/4763  Sessions in Plan of Care: 24  Surgery Date: 05/23/2022  MD Follow-up: No data was found  Medbridge Code: No data was found    Visit Count: 3   Diagnosis:    Diagnosis ICD-10-CM Associated Order   1. Pain in right elbow  M25.521                Subjective     Daily Subjective   Pt states no issues after last visit. States the steristrips have come off. States that he has been working on HEP at home.     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    Objective                     Treatment     Therapeutic Exercises - Justified to address any of the following:  To develop strength, endurance, ROM and/or flexibility.   Performed tricep isometric in supine and prone row with Blood Flow Restriction with Delfi system at 50% PTP on R UE.  BFR utilized for improved UE strength Explained all risks, benefits, and safety precautions prior to use.  Patient performed protocol of 30 reps followed by 3 sets of 15 for each exercise while being closely monitored for excess discomfort and for proper form.  Discussed nutritional needs after BFR training to maximize benefit.    Isometric tricep extension holds RTB with 3 step backs 2x10         Manual Therapy -  Justified to address any of the following:    Mobilization of joints and soft tissues, manipulation, manual lymphatic drainage, and/or manual traction.    STM biceps, triceps, wrist flexors and extensors, pronator teres, supinator  Scar mob       ---      Flowsheet Row ---   Total Time    Timed Minutes 44 minutes   Total Time 44 minutes          Assessment   Some tightnes in end range but good carry over in mobility of the elbow. Worked on some scar mobility. Continued use of BFR for UE strength. Added some dynamic holds for elbow extension. Instructed to work on bicep AROM and tricep isometrics at home.   Plan   Continue per POC, f/u on HEP      Goals      Goal 1: Improve elbow extension strength 5/5 to transfer out of chair or push open doors with involved side.   Sessions: 24      Goal 2: Patient will demonstrate independence in prescribed HEP with proper form, sets and reps for safe discharge to  an independent program.   Sessions: 24      Goal 3: Improve elbow flexion strength 5/5 to be able to lift household items without increased pain   Sessions: 24      Goal 4: Increase elbow flexion to 140 degrees to allow for eating with utensils on involved side.   Sessions: [place] Heylee Tant, DPT

## 2022-06-14 ENCOUNTER — Inpatient Hospital Stay
Payer: BLUE CROSS/BLUE SHIELD | Attending: Orthopaedic Surgery | Admitting: Rehabilitative and Restorative Service Providers"

## 2022-06-14 DIAGNOSIS — M25521 Pain in right elbow: Secondary | ICD-10-CM | POA: Insufficient documentation

## 2022-06-14 NOTE — PT/OT Therapy Note (Unsigned)
Name: Benjamin Keith Age: 42 y.o.   Date of Service: 06/14/2022  Referring Physician: Faustino Congress, MD   Date of Injury: No data found 05/23/2022  PT Date Care Plan Established/Reviewed:06/03/2022  PT Date Treatment Started:06/03/2022  OT Date Care Plan Established/Reviewed: No data found  OT Date Treatment Started: No data found    (Historic) Date of Injury:No data was found  (Historic) Date Care Plan Established/Reviewed No data was found  No data was found  (Historic) Date Treatment Started No data was found No data was found    End of Certification Date: 07/28/74  Sessions in Plan of Care: 24  Surgery Date: 05/23/2022  MD Follow-up: No data was found  Medbridge Code: No data was found    Visit Count: 4   Diagnosis:    Diagnosis ICD-10-CM Associated Order   1. Pain in right elbow  M25.521                Subjective     Daily Subjective   Pt states that he has been doing well. No soreness after last visit. States that he has been continuing to limit use of the UE.     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    Objective                     Treatment     Therapeutic Exercises - Justified to address any of the following:  To develop strength, endurance, ROM and/or flexibility.   Performed tricep extension, bicep curl with Blood Flow Restriction with Delfi system at 50% PTP on R UE.  BFR utilized for improved UE strength Explained all risks, benefits, and safety precautions prior to use.  Patient performed protocol of 30 reps followed by 3 sets of 15 for each exercise while being closely monitored for excess discomfort and for proper form.  Discussed nutritional needs after BFR training to maximize benefit.           Manual Therapy - Justified to address any of the following:    Mobilization of joints and  soft tissues, manipulation, manual lymphatic drainage, and/or manual traction.    STM biceps, triceps, wrist flexors and extensors, pronator teres, supinator  Scar mob  Proxmal radioulnar mobs grade Keith       ---      Flowsheet Row ---   Total Time    Timed Minutes 45 minutes   Total Time 45 minutes          Assessment   Continued work on progression in UE strength with use of BFR. Was able to get into improved elbow extension. Continued to emphasize need to avoid heavy use of the arm due to surgical healing.   Plan   Continue per POC, progress as per protocol       Goals      Goal 1: Improve elbow extension strength 5/5 to transfer out of chair or push open doors with involved side.   Sessions: 24      Goal 2: Patient will demonstrate independence in prescribed HEP with proper form, sets and reps for safe discharge to an independent program.   Sessions: 24      Goal 3:  Improve elbow flexion strength 5/5 to be able to lift household items without increased pain   Sessions: 24      Goal 4: Increase elbow flexion to 140 degrees to allow for eating with utensils on involved side.   Sessions: [place] Benjamin Keith, DPT

## 2022-06-18 ENCOUNTER — Inpatient Hospital Stay
Payer: BLUE CROSS/BLUE SHIELD | Attending: Orthopaedic Surgery | Admitting: Rehabilitative and Restorative Service Providers"

## 2022-06-18 DIAGNOSIS — M25521 Pain in right elbow: Secondary | ICD-10-CM

## 2022-06-18 NOTE — PT/OT Therapy Note (Signed)
Name: Benjamin Keith Age: 42 y.o.   Date of Service: 06/18/2022  Referring Physician: Faustino Congress, MD   Date of Injury: No data found 05/23/2022  PT Date Care Plan Established/Reviewed:06/03/2022  PT Date Treatment Started:06/03/2022  OT Date Care Plan Established/Reviewed: No data found  OT Date Treatment Started: No data found    (Historic) Date of Injury:No data was found  (Historic) Date Care Plan Established/Reviewed No data was found  No data was found  (Historic) Date Treatment Started No data was found No data was found    End of Certification Date: 12/28/3823  Sessions in Plan of Care: 24  Surgery Date: 05/23/2022  MD Follow-up: No data was found  Medbridge Code: No data was found    Visit Count: 5   Diagnosis:    Diagnosis ICD-10-CM Associated Order   1. Pain in right elbow  M25.521                Subjective     Daily Subjective   Pt states that he has been doing well. No issues after last visit. States he gets some random pain in the scar region. States that he hasn't had much of an issue in the elbow.     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    Objective     Active Range of Motion (degrees)     Right Elbow   Flexion: 138 degrees   Extension: -2 degrees                     Treatment     Therapeutic Exercises - Justified to address any of the following:  To develop strength, endurance, ROM and/or flexibility.   Performed tricep extension, bicep curl with Blood Flow Restriction with Delfi system at 50% PTP on R UE. BFR utilized for improved UE strength Explained all risks, benefits, and safety precautions prior to use. Patient performed protocol of 30 reps followed by 3 sets of 15 for each exercise while being closely monitored for excess discomfort and for proper form. Discussed nutritional  needs after BFR training to maximize benefit.    Row with black band 2x10 with 2-3sec retract holds    Manual Therapy - Justified to address any of the following:    Mobilization of joints and soft tissues, manipulation, manual lymphatic drainage, and/or manual traction.    STM biceps, triceps, wrist flexors and extensors, pronator teres, supinator  Scar mob  Proxmal radioulnar mobs grade Keith            Goals      Goal 1: Improve elbow extension strength 5/5 to transfer out of chair or push open doors with involved side.   Sessions: 24      Goal 2: Patient will demonstrate independence in prescribed HEP with proper form, sets and reps for safe discharge to an independent program.   Sessions: 24      Goal 3: Improve elbow flexion strength 5/5 to be able to lift household items without increased pain   Sessions: 24      Goal 4: Increase elbow flexion to 140 degrees to allow for eating with utensils on involved side.   Sessions: 24  Almon Register, DPT

## 2022-06-20 ENCOUNTER — Inpatient Hospital Stay
Payer: BLUE CROSS/BLUE SHIELD | Attending: Orthopaedic Surgery | Admitting: Rehabilitative and Restorative Service Providers"

## 2022-06-20 DIAGNOSIS — M25521 Pain in right elbow: Secondary | ICD-10-CM

## 2022-06-20 NOTE — PT/OT Therapy Note (Signed)
Name: Benjamin Keith Age: 42 y.o.   Date of Service: 06/20/2022  Referring Physician: Hermelinda Dellen, MD   Date of Injury: No data found 05/23/2022  PT Date Care Plan Established/Reviewed:06/03/2022  PT Date Treatment Started:06/03/2022  OT Date Care Plan Established/Reviewed: No data found  OT Date Treatment Started: No data found    (Historic) Date of Injury:No data was found  (Historic) Date Care Plan Established/Reviewed No data was found  No data was found  (Historic) Date Treatment Started No data was found No data was found    End of Certification Date: 08/31/2022  Sessions in Plan of Care: 24  Surgery Date: 05/23/2022  MD Follow-up: No data was found  Medbridge Code: No data was found    Visit Count: 6   Diagnosis:    Diagnosis ICD-10-CM Associated Order   1. Pain in right elbow  M25.521                Subjective     Daily Subjective   Pt states no issues after last visit. States that he still has some tenderness on the scar. States that he had no soreness after last visit.     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    Objective                     Treatment     Therapeutic Exercises - Justified to address any of the following:  To develop strength, endurance, ROM and/or flexibility.   Performed tricep extension 2# and bicep curl 3# and shoulder front raise 3# with Blood Flow Restriction with Delfi system at 50% PTP on R UE.  BFR utilized for improved UE strength Explained all risks, benefits, and safety precautions prior to use.  Patient performed protocol of 30 reps followed by 3 sets of 15 for each exercise while being closely monitored for excess discomfort and for proper form.  Discussed nutritional needs after BFR training to maximize benefit.            Neuromuscular Re-Education - Justified  to address any of the following:   Re-education of movement, balance, coordination, kinesthetic sense, posture and/or proprioception for sitting and/or standing activities.   Prone I  T Y with tactile cues to facilitate scapular motor control and awareness 2x10 ea position    Manual Therapy - Justified to address any of the following:    Mobilization of joints and soft tissues, manipulation, manual lymphatic drainage, and/or manual traction.    STM biceps, triceps, wrist flexors and extensors, pronator teres, supinator  Scar mob       ---      Flowsheet Row ---   Total Time    Timed Minutes 45 minutes   Total Time 45 minutes          Assessment   Continued to be limited into full supination of the elbow. With tightness along the pronator. Continued use of BFR for strength. Added prone ITY for scapular motor control.   Plan   Cotninue per POC, progress as tolerated      Goals      Goal 1: Improve elbow extension strength 5/5 to transfer out of chair or push open  doors with involved side.   Sessions: 24      Goal 2: Patient will demonstrate independence in prescribed HEP with proper form, sets and reps for safe discharge to an independent program.   Sessions: 24      Goal 3: Improve elbow flexion strength 5/5 to be able to lift household items without increased pain   Sessions: 24      Goal 4: Increase elbow flexion to 140 degrees to allow for eating with utensils on involved side.   Sessions: 78                                  Thora Lance, DPT

## 2022-06-25 ENCOUNTER — Inpatient Hospital Stay
Payer: BLUE CROSS/BLUE SHIELD | Attending: Orthopaedic Surgery | Admitting: Rehabilitative and Restorative Service Providers"

## 2022-06-25 DIAGNOSIS — M25521 Pain in right elbow: Secondary | ICD-10-CM | POA: Insufficient documentation

## 2022-06-25 NOTE — PT/OT Therapy Note (Signed)
Name: Benjamin Keith Age: 42 y.o.   Date of Service: 06/25/2022  Referring Physician: Faustino Congress, MD   Date of Injury: No data found 05/23/2022  PT Date Care Plan Established/Reviewed:06/03/2022  PT Date Treatment Started:06/03/2022  OT Date Care Plan Established/Reviewed: No data found  OT Date Treatment Started: No data found    (Historic) Date of Injury:No data was found  (Historic) Date Care Plan Established/Reviewed No data was found  No data was found  (Historic) Date Treatment Started No data was found No data was found    End of Certification Date: 10/29/7844  Sessions in Plan of Care: 24  Surgery Date: 05/23/2022  MD Follow-up: No data was found  Medbridge Code: No data was found    Visit Count: 7   Diagnosis:    Diagnosis ICD-10-CM Associated Order   1. Pain in right elbow  M25.521                Subjective     Daily Subjective   Pt states that he has been doing well. No soreness from last visit. The tenderness at the scar is not as irritable. States that he has no complaints currently.     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    Objective                     Treatment     Therapeutic Exercises - Justified to address any of the following:  To develop strength, endurance, ROM and/or flexibility.   Performed tricep extension in supine 3# and bicep curl 4# with Blood Flow Restriction with Delfi system at 50% PTP on R UE.  BFR utilized for improved UE strength Explained all risks, benefits, and safety precautions prior to use.  Patient performed protocol of 30 reps followed by 3 sets of 15 for each exercise while being closely monitored for excess discomfort and for proper form.  Discussed nutritional needs after BFR training to maximize benefit.    Serratus punch 5# 3x10  Swans green band  3x10      Manual Therapy - Justified to address any of the following:    Mobilization of joints and soft tissues, manipulation, manual lymphatic drainage, and/or manual traction.    STM wrist flexors and extensors, pronator teres, supinator, biceps       ---      Flowsheet Row ---   Total Time    Timed Minutes 42 minutes   Total Time 42 minutes          Assessment   Great carry over in extension mobility in the elbow. Progressed strength with BFR. Added scapular strength also. Progressing very well.   Plan   Continue per POC, progress strength as able      Goals      Goal 1: Improve elbow extension strength 5/5 to transfer out of chair or push open doors with involved side.   Sessions: 24      Goal 2: Patient will demonstrate independence in prescribed HEP with proper form, sets and reps for safe discharge to an independent program.   Sessions: 24      Goal 3: Improve elbow flexion strength 5/5 to be able to  lift household items without increased pain   Sessions: 24      Goal 4: Increase elbow flexion to 140 degrees to allow for eating with utensils on involved side.   Sessions: [place] Ela Moffat, DPT

## 2022-06-27 ENCOUNTER — Inpatient Hospital Stay: Payer: BLUE CROSS/BLUE SHIELD | Admitting: Rehabilitative and Restorative Service Providers"

## 2022-07-01 ENCOUNTER — Inpatient Hospital Stay
Payer: BLUE CROSS/BLUE SHIELD | Attending: Orthopaedic Surgery | Admitting: Rehabilitative and Restorative Service Providers"

## 2022-07-01 ENCOUNTER — Encounter (INDEPENDENT_AMBULATORY_CARE_PROVIDER_SITE_OTHER): Payer: Self-pay | Admitting: Orthopaedic Surgery

## 2022-07-01 ENCOUNTER — Ambulatory Visit (INDEPENDENT_AMBULATORY_CARE_PROVIDER_SITE_OTHER): Payer: BLUE CROSS/BLUE SHIELD | Admitting: Orthopaedic Surgery

## 2022-07-01 VITALS — BP 146/83 | HR 83

## 2022-07-01 DIAGNOSIS — Z4789 Encounter for other orthopedic aftercare: Secondary | ICD-10-CM

## 2022-07-01 DIAGNOSIS — M25521 Pain in right elbow: Secondary | ICD-10-CM

## 2022-07-01 DIAGNOSIS — S46311A Strain of muscle, fascia and tendon of triceps, right arm, initial encounter: Secondary | ICD-10-CM

## 2022-07-01 NOTE — Progress Notes (Signed)
[facility]    CHIEF COMPLAINT:  Postoperative follow-up right triceps.    Benjamin Keith is a 42 year old male 5.5 weeks status post right open distal triceps debridement and augmentation with repair. Date of surgery is 05/23/2022.    HISTORY OF PRESENT ILLNESS:  The patient is doing well.  He is able to fully extend his arm and flex to 135 degrees.  The patient has been performing school crushers at 7 pounds, ambulating like a fireman's carry with about 12 pounds, curls, and feels he can do more.    For other past medical history, social history, family history and past surgical history please see them listed below.     REVIEW OF SYSTEMS:  Constitutional: No fatigue, fever, weight loss, or weight gain.    Ears, Nose, Mouth & Throat: No sore throat or hearing loss.    Cardiovascular: No chest pain, blood clots, or leg cramps.    Respiratory: No shortness of breath, cough, or difficulty breathing.    Gastrointestinal: No nausea, vomiting, diarrhea, or loss of appetite.    Genitourinary: No polyuria or kidney disease.    Musculoskeletal: No joint aches, muscle weakness or swelling of joints/body parts other than that mentioned above.   Integumentary: No finger nail changes or skin dryness.    Neurological: No numbness, burning discomfort, or headaches.    Psychiatric: No depression or anxiety.    Endocrine: No increased thirst, change in appetite or thyroid disease.    Hematologic/Lymphatic: No easy bruising or anemia.    PHYSICAL EXAMINATION:  General: The patient was awake, alert, oriented, easily engaged, displayed logical thinking with clear speech and was neat in appearance. Their general appearance was normal, well-developed and well-nourished.     Right Elbow Exam:   Skin: Intact   Erythema: None present   Swelling: None present   Atrophy: No identifiable   TTP at Medial epicondyle: None   TTP at Lateral epicondyle: None   TTP Olecranon: None   Other Areas of TTP: None   ROM: 0-135  degrees.  Strength: WNL   Pain with resisted wrist extension: Negative   Pain with resisted finger extension: Negative   Pain with resisted supination: Negative   Pain with resisted wrist flexion: Negative   Pain with resisted finger flexion: Negative   Pain with resisted pronation: Negative   Ulnar Tinel's sign: Negative   Terminal flexion sign: Negative   Radial Pulse 2+   DTR and Pathological Reflexes Intact   No lymphadenopathy   Radial/Median/Ulnar Nerves intact to motor and sensory provocation   Hand Perfused with CR <2 sec     ASSESSMENT/PLAN:  Benjamin Keith is a 42 year old male 5.5 weeks status post right open distal triceps debridement and augmentation with repair.    The patient will continue physical therapy and therapeutic exercises per protocol as well as his home exercise program. He was advised to double his curls 2 days a week and gradually advance as tolerated each week. Patient was instructed on the dos and don'ts regarding protection of the surgical procedure, as well as appropriate activity levels going forward. The patient understands the importance of a dedicated therapy program. He will follow up in 6 weeks. The patient will notify my clinic of any changes or worsening of their symptoms during the interim. All the patient's questions were answered appropriately and completely. The patient understands the plan.     The review of the patient's medications does not in any way constitute an endorsement, by  this clinician, of their use, dosage, indications, route, efficacy, interactions, or other clinical parameters.    Faustino Congress MD, [place]    Transcribed for Dr. Marshell Levan, by Alfonso Patten on 07/01/2022 at 7:46. Powered by [employer].

## 2022-07-01 NOTE — PT/OT Therapy Note (Signed)
Name: Benjamin Keith Age: 42 y.o.   Date of Service: 07/01/2022  Referring Physician: Faustino Congress, MD   Date of Injury: No data found 05/23/2022  PT Date Care Plan Established/Reviewed:06/03/2022  PT Date Treatment Started:06/03/2022  OT Date Care Plan Established/Reviewed: No data found  OT Date Treatment Started: No data found    (Historic) Date of Injury:No data was found  (Historic) Date Care Plan Established/Reviewed No data was found  No data was found  (Historic) Date Treatment Started No data was found No data was found    End of Certification Date: 10/25/4429  Sessions in Plan of Care: 24  Surgery Date: 05/23/2022  MD Follow-up: No data was found  Medbridge Code: No data was found    Visit Count: 8   Diagnosis:    Diagnosis ICD-10-CM Associated Order   1. Pain in right elbow  M25.521                Subjective     Daily Subjective   Pt states that he has been doing well. No limitations with daily task with the UE that are not heavy lifting. States that he will see the MD today.     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    Objective                     Treatment     Therapeutic Exercises - Justified to address any of the following:  To develop strength, endurance, ROM and/or flexibility.   Performed supine tricep extension 7# and bicep curl 9# and chest press 9# ea with Blood Flow Restriction with Delfi system at 50% PTP on R UE.  BFR utilized for improved UE strength Explained all risks, benefits, and safety precautions prior to use.  Patient performed protocol of 30 reps followed by 3 sets of 15 for each exercise while being closely monitored for excess discomfort and for proper form.  Discussed nutritional needs after BFR training to maximize benefit.    Row keiser 25# 3x10    Mare Ferrari carry 12kg  unilateral 3x    Manual Therapy - Justified to address any of the following:    Mobilization of joints and soft tissues, manipulation, manual lymphatic drainage, and/or manual traction.    STM biceps, tricep, wrist flexors and pronator teres       ---      Flowsheet Row ---   Total Time    Timed Minutes 45 minutes   Total Time 45 minutes          Assessment   Continues to have good carryover in elbow extension from last visit. Pt no adverse reaction from last visit. Almost 6 weeks post surgical. So worked on progression in strength visit.   Plan   Continue per POC, f/u on MD      Goals      Goal 1: Improve elbow extension strength 5/5 to transfer out of chair or push open doors with involved side.   Sessions: 24      Goal 2: Patient will demonstrate independence in prescribed HEP with proper form, sets and reps for safe discharge to an independent program.   Sessions: 24  Goal 3: Improve elbow flexion strength 5/5 to be able to lift household items without increased pain   Sessions: 24      Goal 4: Increase elbow flexion to 140 degrees to allow for eating with utensils on involved side.   Sessions: [place] Steffani Dionisio, DPT

## 2022-07-02 ENCOUNTER — Ambulatory Visit (INDEPENDENT_AMBULATORY_CARE_PROVIDER_SITE_OTHER): Payer: BLUE CROSS/BLUE SHIELD | Admitting: Orthopaedic Surgery

## 2022-07-03 ENCOUNTER — Inpatient Hospital Stay: Payer: BLUE CROSS/BLUE SHIELD | Admitting: Rehabilitative and Restorative Service Providers"

## 2022-07-08 ENCOUNTER — Inpatient Hospital Stay: Payer: BLUE CROSS/BLUE SHIELD | Admitting: Rehabilitative and Restorative Service Providers"

## 2022-07-12 ENCOUNTER — Inpatient Hospital Stay
Payer: BLUE CROSS/BLUE SHIELD | Attending: Orthopaedic Surgery | Admitting: Rehabilitative and Restorative Service Providers"

## 2022-07-12 DIAGNOSIS — M25521 Pain in right elbow: Secondary | ICD-10-CM

## 2022-07-12 NOTE — PT/OT Therapy Note (Signed)
Name: Mary Gavrilov III Age: 42 y.o.   Date of Service: 07/12/2022  Referring Physician: Faustino Congress, MD   Date of Injury: No data found 05/23/2022  PT Date Care Plan Established/Reviewed:06/03/2022  PT Date Treatment Started:06/03/2022  OT Date Care Plan Established/Reviewed: No data found  OT Date Treatment Started: No data found    (Historic) Date of Injury:No data was found  (Historic) Date Care Plan Established/Reviewed No data was found  No data was found  (Historic) Date Treatment Started No data was found No data was found    End of Certification Date: [id number]  Sessions in Plan of Care: 24  Surgery Date: 05/23/2022  MD Follow-up: No data was found  Medbridge Code: No data was found    Visit Count: 9   Diagnosis:    Diagnosis ICD-10-CM Associated Order   1. Pain in right elbow  M25.521                Subjective     Daily Subjective   Pt states that he saw the MD who was pleased with progress. Pt states that he has been able to do exercises at home. States that MD said that he can push the UE.     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    Objective                     Treatment     Therapeutic Exercises - Justified to address any of the following:  To develop strength, endurance, ROM and/or flexibility.   Keiser row 35# 3x10  Tricep extension on kesier 20# 3x10  Bicep curl 18# 3x10  High row with B ER at 90 GTB 3x10    Neuromuscular Re-Education - Justified to address any of the following:   Re-education of movement, balance, coordination, kinesthetic sense, posture and/or proprioception for sitting and/or standing activities.   Rudean Haskell holds on bosu with march with LE to facilitate scapular stabilization and motor control with cues to maintain elbow extension  Plank holds at EOB with manual  resistance along the scapular to facilitate motor control and stabilization        ---      Flowsheet Row ---   Total Time    Timed Minutes 43 minutes   Total Time 43 minutes          Assessment   Continued focus on progression in strength this visit without any c/o discomfort. Added some work on scapular stabilization and terminal elbow extension motor control in weight bearing positions.   Plan   Continue per POC, continue work on extension strength      Goals      Goal 1: Improve elbow extension strength 5/5 to transfer out of chair or push open doors with involved side.   Sessions: 24      Goal 2: Patient will demonstrate independence in prescribed HEP with proper form, sets and reps for safe discharge to an independent program.   Sessions: 24      Goal 3: Improve elbow flexion strength 5/5 to be able to lift household items without increased pain   Sessions: 24      Goal 4: Increase elbow flexion to  140 degrees to allow for eating with utensils on involved side.   Sessions: [place] Ahleah Simko, DPT

## 2022-07-15 ENCOUNTER — Inpatient Hospital Stay: Payer: BLUE CROSS/BLUE SHIELD | Admitting: Rehabilitative and Restorative Service Providers"

## 2022-07-19 ENCOUNTER — Inpatient Hospital Stay: Payer: BLUE CROSS/BLUE SHIELD | Admitting: Rehabilitative and Restorative Service Providers"

## 2022-07-26 ENCOUNTER — Inpatient Hospital Stay
Payer: BLUE CROSS/BLUE SHIELD | Attending: Orthopaedic Surgery | Admitting: Rehabilitative and Restorative Service Providers"

## 2022-07-26 DIAGNOSIS — M25521 Pain in right elbow: Secondary | ICD-10-CM | POA: Insufficient documentation

## 2022-07-26 NOTE — Progress Notes (Signed)
Name:Benjamin Keith Age: 42 y.o.   Date of Service: 07/26/2022  Referring Physician: Faustino Congress, MD   Date of Injury: No data found 05/23/2022  PT Date Care Plan Established/Reviewed:07/26/2022  PT Date Treatment Started:06/03/2022  OT Date Care Plan Established/Reviewed: No data found  OT Date Treatment Started: No data found    (Historic) Date of Injury:No data was found  (Historic) Date Care Plan Established/Reviewed No data was found  No data was found  (Historic) Date Treatment Started No data was found No data was found    End of Certification Date: 0000000  Sessions in Plan of Care: 11  Surgery Date: 05/23/2022  MD Follow-up: No data was found  Medbridge Code: No data was found    Visit Count: 10   Diagnosis:    Diagnosis ICD-10-CM Associated Order   1. Pain in right elbow  M25.521           Subjective     Daily Subjective   Pt states that he has been doing well overall. States that when he holds his ahand and tries to do a tricep extension he feels some discomfort in the elbow region that is not excruciating but it there. Otherwise he has been straightening without much issue.     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    No past medical history on file.    Objective               06/03/2022 06/18/2022 07/26/2022   Cervical/Thoracic Strength (/5)      Left shoulder flexion 5      Left shoulder extension 5      Left shoulder abduction (C5) 5      Left shoulder internal rotation 5      Left shoulder external rotation (C5) 5      Left elbow flexion (C6) 5      Left elbow extension (C7) 5      Right elbow flexion (C6)   5    Right elbow extension (C7)   4+    Elbow AROM (degrees)      Cervical AROM WFL Yes      Left flexion 141      Left extension -2      Right flexion  138  136    Right extension  -2   1    Elbow PROM      Right flexion 126      Right extension -8      Elbow Strength (/5)      Left elbow flexion 5      Left elbow extension 5      Right elbow flexion   5    Right elbow extension   4+    Right forearm supination   4+    Right forearm pronation   5    Additional strength details R shoulder and elbow not tested due to surgical precaution      Shoulder AROM      Left Flexion 164      Left Abduction 170      Left External rotation 90 82      Left Internal rotation 90 75      Right  Flexion 146      Shoulder Strength (/5)      Left shoulder flexion 5      Left shoulder extension 5      Left shoulder abduction 5      Left shoulder external rotation 0 5      Left shoulder internal rotation 0 (C6) 5      Wrist Strength (/5)      Left elbow flexion 5      Left elbow extension 5      Right elbow flexion   5    Right elbow extension   4+    Right forearm supination   4+    Right forearm pronation   5    Hand AROM (degrees)      Cervical AROM WFL Yes      Upper Body Strength (/5)      Left shoulder flexion 5      Left shoulder extension 5      Left shoulder abduction 5      Left external rotation at 0(C5) 5      Left internal rotation at 0 (C6) 5      Left elbow flexion (C6)  5      Left elbow extension 5      Additional elbow strength details R shoulder and elbow not tested due to surgical precaution                       ---      Flowsheet Row ---   Total Time    Timed Minutes 45 minutes   Total Time 45 minutes          Assessment   Pt is a 42yo male s/p R elbow arthroscopy. Pt has been seen for 10 visits. Pt has excellent ROM and improving strength of the UE. Pt continues to have some difficulty with trunk and scapular stability with integration through the UE. Some tenderness at the insertion of the tricep and some tricep weakness. Pt would continue to benefit from continued work on progression of strength stability to return to PLOF and reduce risk of reinjury.   Prognosis: excellent  Patient is aware  of diagnosis, prognosis and consents to plan of care: Yes  Plan   Visits per week: 2  Number of Sessions: 11  Direct One on One  97110: Therapeutic Exercise: To Develop Strength and Endurance, ROM and Flexibility  [id number]: Neuromuscular Reeducation  97140: Manual Therapy techniques (mobilization, manipulation, manual traction)  97530: Therapeutic Activities: Dynamic activities to improve functional performance  Dry Needling  Continue per POC      Goals      Goal 1: Improve elbow extension strength 5/5 to transfer out of chair or push open doors with involved side.    07/26/22 progressing DL   Sessions: 20      Goal 2: Patient will demonstrate independence in prescribed HEP with proper form, sets and reps for safe discharge to an independent program.    07/26/22 pt states compliance DL   Sessions: 20      Goal 3: Improve elbow flexion strength 5/5 to be able to lift household items without increased pain    07/26/22 progressing DL   Sessions: 20      Goal 4: Increase elbow flexion to 140 degrees to allow for eating with utensils on involved side.    07/26/22 progressing DL    Sessions: 20  Almon Register, DPT

## 2022-07-26 NOTE — PT/OT Therapy Note (Addendum)
Discontinuation of Therapy Services    [facility] Keith did not complete prescribed physical therapy visits.      Status is unknown at this time, physical therapy has been discontinued and patient has been discharged from care.  The last therapy note is below for review.    Please feel free to contact me with any questions regarding the care of [facility] Keith.    Sincerely,    Thora Lance, DPT        09/16/2022        Name: Benjamin Keith Age: 42 y.o.   Date of Service: 07/26/2022  Referring Physician: Hermelinda Dellen, MD   Date of Injury: No data found 05/23/2022  PT Date Care Plan Established/Reviewed:07/26/2022  PT Date Treatment Started:06/03/2022  OT Date Care Plan Established/Reviewed: No data found  OT Date Treatment Started: No data found    (Historic) Date of Injury:No data was found  (Historic) Date Care Plan Established/Reviewed No data was found  No data was found  (Historic) Date Treatment Started No data was found No data was found    End of Certification Date: 10/23/2022  Sessions in Plan of Care: 11  Surgery Date: 05/23/2022  MD Follow-up: No data was found  Medbridge Code: No data was found    Visit Count: 10   Diagnosis:    Diagnosis ICD-10-CM Associated Order   1. Pain in right elbow  M25.521                Subjective     Daily Subjective   Pt states that he has been doing well overall. States that when he holds his ahand and tries to do a tricep extension he feels some discomfort in the elbow region that is not excruciating but it there. Otherwise he has been straightening without much issue.     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    Objective     Active Range of Motion (degrees)     Right Elbow   Flexion: 136 degrees   Extension: 1 degrees      Strength/Myotome Testing (/5)     Right Elbow   Flexion: 5  Extension: 4+  Forearm supination: 4+  Forearm pronation: 5                    Treatment     Therapeutic Exercises - Justified to address any of the following:  To develop strength, endurance, ROM and/or flexibility.   Assessed ROM and strength  Overhead tricep extension 20# 3x10  Bicep curl 25# 3x10  Push up at bar 3x10    Neuromuscular Re-Education - Justified to address any of the following:   Re-education of movement, balance, coordination, kinesthetic sense, posture and/or proprioception for sitting and/or standing activities.   Bear crawl focus on elbow extension to work on scapular and UE stability   Decel ball drops with 10# 2x15 drops focus on motor control through the UE  Quad holds with pertubation focus on elbow extenison and scapular motor control and core control     Manual Therapy - Justified to address any of the following:    Mobilization of joints and soft tissues, manipulation, manual lymphatic  drainage, and/or manual traction.    STM tricep  Scar mobs  CFM tricep tendon       ---      Flowsheet Row ---   Total Time    Timed Minutes 45 minutes   Total Time 45 minutes          Assessment   Pt is a 42yo male s/p R elbow arthroscopy. Pt has been seen for 10 visits. Pt has excellent ROM and improving strength of the UE. Pt continues to have some difficulty with trunk and scapular stability with integration through the UE. Some tenderness at the insertion of the tricep and some tricep weakness. Pt would continue to benefit from continued work on progression of strength stability to return to PLOF and reduce risk of reinjury.   Plan   Continue per POC      Goals      Goal 1: Improve elbow extension strength 5/5 to transfer out of chair or push open doors with involved side.    07/26/22 progressing DL   Sessions: 20      Goal 2: Patient will demonstrate independence in prescribed HEP with proper form, sets and reps for safe discharge to an  independent program.    07/26/22 pt states compliance DL   Sessions: 20      Goal 3: Improve elbow flexion strength 5/5 to be able to lift household items without increased pain    07/26/22 progressing DL   Sessions: 20      Goal 4: Increase elbow flexion to 140 degrees to allow for eating with utensils on involved side.    07/26/22 progressing DL    Sessions: 20                                  Thora Lance, DPT

## 2022-07-30 ENCOUNTER — Inpatient Hospital Stay: Payer: BLUE CROSS/BLUE SHIELD | Admitting: Rehabilitative and Restorative Service Providers"

## 2022-08-06 ENCOUNTER — Inpatient Hospital Stay: Payer: BLUE CROSS/BLUE SHIELD | Admitting: Rehabilitative and Restorative Service Providers"

## 2022-08-09 ENCOUNTER — Inpatient Hospital Stay: Payer: BLUE CROSS/BLUE SHIELD | Admitting: Rehabilitative and Restorative Service Providers"

## 2022-08-12 ENCOUNTER — Encounter (INDEPENDENT_AMBULATORY_CARE_PROVIDER_SITE_OTHER): Payer: Self-pay | Admitting: Orthopaedic Surgery

## 2022-08-12 ENCOUNTER — Ambulatory Visit (INDEPENDENT_AMBULATORY_CARE_PROVIDER_SITE_OTHER): Payer: BLUE CROSS/BLUE SHIELD | Admitting: Orthopaedic Surgery

## 2022-08-12 VITALS — BP 152/81 | HR 69 | Ht 71.0 in | Wt 207.0 lb

## 2022-08-12 DIAGNOSIS — Z4789 Encounter for other orthopedic aftercare: Secondary | ICD-10-CM

## 2022-08-12 DIAGNOSIS — S46311A Strain of muscle, fascia and tendon of triceps, right arm, initial encounter: Secondary | ICD-10-CM

## 2022-08-12 NOTE — Progress Notes (Signed)
[facility]    CHIEF COMPLAINT:  Status-post right open distal triceps debridement with repair.     Benjamin Keith is a 42 year old male who is 11 weeks status post right open distal triceps debridement with repair. Date of surgery is 05/23/2022.    HISTORY OF PRESENT ILLNESS:  He is doing great.   He is going to PT once a week.  The patient has been traveling for work for the last 2 weeks.   He feels 95 percent better.   He has no limitations whatsoever.  The patient does push-ups and has no problems.   Patient is benching.    The scar tissue does not bother him.  He can drive.    For other past medical history, social history, family history and past surgical history please see them listed below.     REVIEW OF SYSTEMS:  Constitutional: No fatigue, fever, weight loss, or weight gain.    Ears, Nose, Mouth & Throat: No sore throat or hearing loss.    Cardiovascular: No chest pain, blood clots, or leg cramps.    Respiratory: No shortness of breath, cough, or difficulty breathing.    Gastrointestinal: No nausea, vomiting, diarrhea, or loss of appetite.    Genitourinary: No polyuria or kidney disease.    Musculoskeletal: No joint aches, muscle weakness or swelling of joints/body parts other than that mentioned above.   Integumentary: No finger nail changes or skin dryness.    Neurological: No numbness, burning discomfort, or headaches.    Psychiatric: No depression or anxiety.    Endocrine: No increased thirst, change in appetite or thyroid disease.    Hematologic/Lymphatic: No easy bruising or anemia.    PHYSICAL EXAMINATION:  General: The patient was awake, alert, oriented, easily engaged, displayed logical thinking with clear speech and was neat in appearance. Their general appearance was normal, well-developed and well-nourished.     Right Elbow Exam:   Skin Intact   Erythema None present   Swelling None present   Atrophy No identifiable   TTP at Medial epicondyle: None   TTP at Lateral epicondyle: None    TTP Olecranon: None   Other Areas of TTP None     ROM: 0-145   Strength: WNL   Pain with resisted wrist extension: Negative   Pain with resisted finger extension: Negative   Pain with resisted supination: Negative   Pain with resisted wrist flexion: Negative   Pain with resisted finger flexion: Negative   Pain with resisted pronation: Negative   Ulnar Tinel's sign: Negative   Terminal flexion sign: Negative   Radial Pulse 2+   DTR and Pathological Reflexes Intact   No lymphadenopathy   Radial/Median/Ulnar Nerves intact to motor and sensory provocation   Hand Perfused with CR <2 sec     ASSESSMENT/PLAN:  Patient is a 42 year old male who presents for status post right open distal triceps debridement with repair.    He can taper off PT. Continue workouts as discussed.    The patient will follow up as needed.     The review of the patient's medications does not in any way constitute an endorsement, by this clinician, of their use, dosage, indications, route, efficacy, interactions, or other clinical parameters.    Faustino Congress MD, [place]    Transcribed for Dr. Marshell Levan, by Satira Pertuit on 08/12/2022 at 1:14 PM. Powered by [employer].

## 2022-08-13 ENCOUNTER — Inpatient Hospital Stay: Payer: BLUE CROSS/BLUE SHIELD | Admitting: Rehabilitative and Restorative Service Providers"

## 2022-08-16 ENCOUNTER — Inpatient Hospital Stay: Payer: BLUE CROSS/BLUE SHIELD | Admitting: Rehabilitative and Restorative Service Providers"

## 2022-08-20 ENCOUNTER — Inpatient Hospital Stay: Payer: BLUE CROSS/BLUE SHIELD | Admitting: Rehabilitative and Restorative Service Providers"

## 2022-08-23 ENCOUNTER — Inpatient Hospital Stay: Payer: BLUE CROSS/BLUE SHIELD | Admitting: Rehabilitative and Restorative Service Providers"

## 2022-08-30 ENCOUNTER — Inpatient Hospital Stay: Payer: BLUE CROSS/BLUE SHIELD | Admitting: Rehabilitative and Restorative Service Providers"

## 2022-09-06 ENCOUNTER — Inpatient Hospital Stay: Payer: BLUE CROSS/BLUE SHIELD | Admitting: Rehabilitative and Restorative Service Providers"

## 2022-09-13 ENCOUNTER — Inpatient Hospital Stay: Payer: BLUE CROSS/BLUE SHIELD | Admitting: Rehabilitative and Restorative Service Providers"

## 2022-09-20 ENCOUNTER — Inpatient Hospital Stay: Payer: BLUE CROSS/BLUE SHIELD | Admitting: Rehabilitative and Restorative Service Providers"

## 2022-10-11 ENCOUNTER — Encounter (INDEPENDENT_AMBULATORY_CARE_PROVIDER_SITE_OTHER): Payer: Self-pay

## 2022-12-25 ENCOUNTER — Telehealth (INDEPENDENT_AMBULATORY_CARE_PROVIDER_SITE_OTHER): Payer: Self-pay

## 2022-12-25 NOTE — Telephone Encounter (Signed)
Patient called and requested a letter to say when he started care, both surgeries and date he ended care.  I let him know letter could be found in the letters tab in mychart and if he has any difficulty, to call back for help, voiced understanding and appreciation.

## 2023-02-11 ENCOUNTER — Encounter (INDEPENDENT_AMBULATORY_CARE_PROVIDER_SITE_OTHER): Payer: Self-pay | Admitting: Orthopaedic Surgery

## 2023-02-19 ENCOUNTER — Other Ambulatory Visit (INDEPENDENT_AMBULATORY_CARE_PROVIDER_SITE_OTHER): Payer: Self-pay | Admitting: Family Medicine

## 2023-02-19 ENCOUNTER — Encounter (INDEPENDENT_AMBULATORY_CARE_PROVIDER_SITE_OTHER): Payer: Self-pay

## 2024-03-26 ENCOUNTER — Other Ambulatory Visit (INDEPENDENT_AMBULATORY_CARE_PROVIDER_SITE_OTHER): Payer: Self-pay | Admitting: Family Medicine
